# Patient Record
Sex: Male | Born: 1965 | Race: White | Hispanic: No | Marital: Married | Smoking: Current every day smoker
Health system: Southern US, Community
[De-identification: ages and names within clinical notes are randomized; demographics above are authoritative.]

## PROBLEM LIST (undated history)

## (undated) DIAGNOSIS — Z720 Tobacco use: Secondary | ICD-10-CM

## (undated) DIAGNOSIS — I429 Cardiomyopathy, unspecified: Secondary | ICD-10-CM

## (undated) DIAGNOSIS — I5022 Chronic systolic (congestive) heart failure: Secondary | ICD-10-CM

## (undated) DIAGNOSIS — R911 Solitary pulmonary nodule: Secondary | ICD-10-CM

## (undated) DIAGNOSIS — I10 Essential (primary) hypertension: Secondary | ICD-10-CM

## (undated) DIAGNOSIS — I340 Nonrheumatic mitral (valve) insufficiency: Secondary | ICD-10-CM

## (undated) DIAGNOSIS — E782 Mixed hyperlipidemia: Secondary | ICD-10-CM

## (undated) DIAGNOSIS — G4733 Obstructive sleep apnea (adult) (pediatric): Secondary | ICD-10-CM

## (undated) DIAGNOSIS — C679 Malignant neoplasm of bladder, unspecified: Secondary | ICD-10-CM

## (undated) DIAGNOSIS — F1010 Alcohol abuse, uncomplicated: Secondary | ICD-10-CM

## (undated) HISTORY — PX: REPLACEMENT TOTAL KNEE: SUR1224

## (undated) HISTORY — DX: Tobacco use: Z72.0

## (undated) HISTORY — DX: Solitary pulmonary nodule: R91.1

## (undated) HISTORY — DX: Alcohol abuse, uncomplicated: F10.10

## (undated) HISTORY — DX: Nonrheumatic mitral (valve) insufficiency: I34.0

## (undated) HISTORY — DX: Mixed hyperlipidemia: E78.2

## (undated) HISTORY — DX: Cardiomyopathy, unspecified: I42.9

## (undated) HISTORY — DX: Chronic systolic (congestive) heart failure: I50.22

## (undated) HISTORY — DX: Obstructive sleep apnea (adult) (pediatric): G47.33

---

## 2021-04-19 ENCOUNTER — Emergency Department: Payer: Self-pay

## 2021-04-19 ENCOUNTER — Inpatient Hospital Stay
Admission: EM | Admit: 2021-04-19 | Discharge: 2021-04-21 | DRG: 190 | Disposition: A | Discharge status: Home / Self Care | Payer: Self-pay | Attending: Internal Medicine | Admitting: Internal Medicine

## 2021-04-19 ENCOUNTER — Other Ambulatory Visit: Payer: Self-pay

## 2021-04-19 ENCOUNTER — Observation Stay (HOSPITAL_COMMUNITY)
Admit: 2021-04-19 | Discharge: 2021-04-19 | Disposition: A | Discharge status: Home / Self Care | Payer: Self-pay | Attending: Internal Medicine | Admitting: Internal Medicine

## 2021-04-19 DIAGNOSIS — F10230 Alcohol dependence with withdrawal, uncomplicated: Secondary | ICD-10-CM | POA: Diagnosis present

## 2021-04-19 DIAGNOSIS — D72829 Elevated white blood cell count, unspecified: Secondary | ICD-10-CM | POA: Diagnosis present

## 2021-04-19 DIAGNOSIS — Z720 Tobacco use: Secondary | ICD-10-CM

## 2021-04-19 DIAGNOSIS — I340 Nonrheumatic mitral (valve) insufficiency: Secondary | ICD-10-CM | POA: Diagnosis present

## 2021-04-19 DIAGNOSIS — R778 Other specified abnormalities of plasma proteins: Secondary | ICD-10-CM | POA: Diagnosis present

## 2021-04-19 DIAGNOSIS — Z8551 Personal history of malignant neoplasm of bladder: Secondary | ICD-10-CM

## 2021-04-19 DIAGNOSIS — R911 Solitary pulmonary nodule: Secondary | ICD-10-CM

## 2021-04-19 DIAGNOSIS — J9601 Acute respiratory failure with hypoxia: Secondary | ICD-10-CM

## 2021-04-19 DIAGNOSIS — I429 Cardiomyopathy, unspecified: Secondary | ICD-10-CM | POA: Diagnosis present

## 2021-04-19 DIAGNOSIS — R079 Chest pain, unspecified: Secondary | ICD-10-CM

## 2021-04-19 DIAGNOSIS — E669 Obesity, unspecified: Secondary | ICD-10-CM | POA: Diagnosis present

## 2021-04-19 DIAGNOSIS — R0602 Shortness of breath: Secondary | ICD-10-CM

## 2021-04-19 DIAGNOSIS — F17200 Nicotine dependence, unspecified, uncomplicated: Secondary | ICD-10-CM | POA: Diagnosis present

## 2021-04-19 DIAGNOSIS — J9600 Acute respiratory failure, unspecified whether with hypoxia or hypercapnia: Secondary | ICD-10-CM | POA: Diagnosis present

## 2021-04-19 DIAGNOSIS — I10 Essential (primary) hypertension: Secondary | ICD-10-CM | POA: Diagnosis present

## 2021-04-19 DIAGNOSIS — J441 Chronic obstructive pulmonary disease with (acute) exacerbation: Principal | ICD-10-CM | POA: Diagnosis present

## 2021-04-19 DIAGNOSIS — I110 Hypertensive heart disease with heart failure: Secondary | ICD-10-CM | POA: Diagnosis present

## 2021-04-19 DIAGNOSIS — F17210 Nicotine dependence, cigarettes, uncomplicated: Secondary | ICD-10-CM | POA: Diagnosis present

## 2021-04-19 DIAGNOSIS — Z9114 Patient's other noncompliance with medication regimen: Secondary | ICD-10-CM

## 2021-04-19 DIAGNOSIS — Z20822 Contact with and (suspected) exposure to covid-19: Secondary | ICD-10-CM | POA: Diagnosis present

## 2021-04-19 DIAGNOSIS — F17213 Nicotine dependence, cigarettes, with withdrawal: Secondary | ICD-10-CM

## 2021-04-19 DIAGNOSIS — Z96652 Presence of left artificial knee joint: Secondary | ICD-10-CM | POA: Diagnosis present

## 2021-04-19 DIAGNOSIS — Z6831 Body mass index (BMI) 31.0-31.9, adult: Secondary | ICD-10-CM

## 2021-04-19 DIAGNOSIS — I5042 Chronic combined systolic (congestive) and diastolic (congestive) heart failure: Secondary | ICD-10-CM

## 2021-04-19 DIAGNOSIS — Y905 Blood alcohol level of 100-119 mg/100 ml: Secondary | ICD-10-CM | POA: Diagnosis present

## 2021-04-19 DIAGNOSIS — G4733 Obstructive sleep apnea (adult) (pediatric): Secondary | ICD-10-CM | POA: Diagnosis present

## 2021-04-19 DIAGNOSIS — R7989 Other specified abnormal findings of blood chemistry: Secondary | ICD-10-CM | POA: Diagnosis present

## 2021-04-19 DIAGNOSIS — T380X5A Adverse effect of glucocorticoids and synthetic analogues, initial encounter: Secondary | ICD-10-CM | POA: Diagnosis present

## 2021-04-19 DIAGNOSIS — F1020 Alcohol dependence, uncomplicated: Secondary | ICD-10-CM | POA: Diagnosis present

## 2021-04-19 DIAGNOSIS — R0609 Other forms of dyspnea: Secondary | ICD-10-CM

## 2021-04-19 DIAGNOSIS — I444 Left anterior fascicular block: Secondary | ICD-10-CM | POA: Diagnosis present

## 2021-04-19 DIAGNOSIS — R918 Other nonspecific abnormal finding of lung field: Secondary | ICD-10-CM

## 2021-04-19 DIAGNOSIS — F1090 Alcohol use, unspecified, uncomplicated: Secondary | ICD-10-CM

## 2021-04-19 HISTORY — DX: Essential (primary) hypertension: I10

## 2021-04-19 HISTORY — DX: Malignant neoplasm of bladder, unspecified: C67.9

## 2021-04-19 LAB — CBC
HCT: 44.2 % (ref 39.0–52.0)
Hemoglobin: 13.9 g/dL (ref 13.0–17.0)
MCH: 27.4 pg (ref 26.0–34.0)
MCHC: 31.4 g/dL (ref 30.0–36.0)
MCV: 87.2 fL (ref 80.0–100.0)
Platelets: 290 10*3/uL (ref 150–400)
RBC: 5.07 MIL/uL (ref 4.22–5.81)
RDW: 16.9 % — ABNORMAL HIGH (ref 11.5–15.5)
WBC: 14.8 10*3/uL — ABNORMAL HIGH (ref 4.0–10.5)
nRBC: 0 % (ref 0.0–0.2)

## 2021-04-19 LAB — ECHOCARDIOGRAM COMPLETE
AR max vel: 1.69 cm2
AV Area VTI: 1.85 cm2
AV Area mean vel: 1.82 cm2
AV Mean grad: 9 mmHg
AV Peak grad: 18 mmHg
Ao pk vel: 2.12 m/s
Area-P 1/2: 5.46 cm2
Calc EF: 41.4 %
Height: 72 in
MV VTI: 2.36 cm2
S' Lateral: 4.58 cm
Single Plane A2C EF: 38.8 %
Single Plane A4C EF: 45.9 %
Weight: 3760 oz

## 2021-04-19 LAB — TROPONIN I (HIGH SENSITIVITY)
Troponin I (High Sensitivity): 61 ng/L — ABNORMAL HIGH (ref <18)
Troponin I (High Sensitivity): 57 ng/L — ABNORMAL HIGH (ref <18)
Troponin I (High Sensitivity): 52 ng/L — ABNORMAL HIGH (ref <18)
Troponin I (High Sensitivity): 42 ng/L — ABNORMAL HIGH (ref <18)

## 2021-04-19 LAB — BLOOD GAS, ARTERIAL
Acid-Base Excess: 1.3 mmol/L (ref 0.0–2.0)
Bicarbonate: 27.2 mmol/L (ref 20.0–28.0)
O2 Content: 2 L/min
O2 Saturation: 96.6 %
Patient temperature: 37
pCO2 arterial: 47 mmHg (ref 32–48)
pH, Arterial: 7.37 (ref 7.35–7.45)
pO2, Arterial: 74 mmHg — ABNORMAL LOW (ref 83–108)

## 2021-04-19 LAB — BASIC METABOLIC PANEL
Anion gap: 8 (ref 5–15)
BUN: 20 mg/dL (ref 6–20)
CO2: 26 mmol/L (ref 22–32)
Calcium: 8.7 mg/dL — ABNORMAL LOW (ref 8.9–10.3)
Chloride: 110 mmol/L (ref 98–111)
Creatinine, Ser: 1.14 mg/dL (ref 0.61–1.24)
GFR, Estimated: >60 mL/min (ref >60)
Glucose, Bld: 124 mg/dL — ABNORMAL HIGH (ref 70–99)
Potassium: 3.5 mmol/L (ref 3.5–5.1)
Sodium: 144 mmol/L (ref 135–145)

## 2021-04-19 LAB — BRAIN NATRIURETIC PEPTIDE: B Natriuretic Peptide: 233.5 pg/mL — ABNORMAL HIGH (ref 0.0–100.0)

## 2021-04-19 LAB — RESP PANEL BY RT-PCR (FLU A&B, COVID) ARPGX2
Influenza A by PCR: NEGATIVE
Influenza B by PCR: NEGATIVE
SARS Coronavirus 2 by RT PCR: NEGATIVE

## 2021-04-19 LAB — ETHANOL: Alcohol, Ethyl (B): 108 mg/dL — ABNORMAL HIGH (ref <10)

## 2021-04-19 LAB — HIV ANTIBODY (ROUTINE TESTING W REFLEX): HIV Screen 4th Generation wRfx: NONREACTIVE

## 2021-04-19 MED ORDER — ENOXAPARIN SODIUM 60 MG/0.6ML IJ SOSY
0.5000 mg/kg | PREFILLED_SYRINGE | INTRAMUSCULAR | Status: DC
  Administered 2021-04-19 – 2021-04-20 (×2): 52.5 mg via SUBCUTANEOUS
  Filled 2021-04-19 (×2): qty 0.6

## 2021-04-19 MED ORDER — IPRATROPIUM-ALBUTEROL 0.5-2.5 (3) MG/3ML IN SOLN: 3.0000 mL | Freq: Four times a day (QID) | RESPIRATORY_TRACT | Status: DC

## 2021-04-19 MED ORDER — ASPIRIN 81 MG PO CHEW
324.0000 mg | CHEWABLE_TABLET | Freq: Once | ORAL | Status: AC
  Administered 2021-04-19: 324 mg via ORAL
  Filled 2021-04-19: qty 4

## 2021-04-19 MED ORDER — SODIUM CHLORIDE 0.9% FLUSH
3.0000 mL | Freq: Two times a day (BID) | INTRAVENOUS | Status: DC
  Administered 2021-04-19 – 2021-04-21 (×5): 3 mL via INTRAVENOUS

## 2021-04-19 MED ORDER — IPRATROPIUM-ALBUTEROL 0.5-2.5 (3) MG/3ML IN SOLN
3.0000 mL | Freq: Once | RESPIRATORY_TRACT | Status: AC
  Administered 2021-04-19: 3 mL via RESPIRATORY_TRACT
  Filled 2021-04-19: qty 3

## 2021-04-19 MED ORDER — SODIUM CHLORIDE 0.9% FLUSH: 3.0000 mL | INTRAVENOUS | Status: DC | PRN

## 2021-04-19 MED ORDER — ADULT MULTIVITAMIN W/MINERALS CH
1.0000 | ORAL_TABLET | Freq: Every day | ORAL | Status: DC
  Administered 2021-04-19 – 2021-04-21 (×3): 1 via ORAL
  Filled 2021-04-19 (×3): qty 1

## 2021-04-19 MED ORDER — THIAMINE HCL 100 MG PO TABS
100.0000 mg | ORAL_TABLET | Freq: Every day | ORAL | Status: DC
  Administered 2021-04-19 – 2021-04-21 (×3): 100 mg via ORAL
  Filled 2021-04-19 (×3): qty 1

## 2021-04-19 MED ORDER — ENOXAPARIN SODIUM 40 MG/0.4ML IJ SOSY: 40.0000 mg | PREFILLED_SYRINGE | INTRAMUSCULAR | Status: DC

## 2021-04-19 MED ORDER — IPRATROPIUM-ALBUTEROL 0.5-2.5 (3) MG/3ML IN SOLN: 3.0000 mL | RESPIRATORY_TRACT | Status: DC | PRN

## 2021-04-19 MED ORDER — LORAZEPAM 2 MG/ML IJ SOLN: 1.0000 mg | INTRAMUSCULAR | Status: DC | PRN

## 2021-04-19 MED ORDER — LORAZEPAM 2 MG/ML IJ SOLN: 0.0000 mg | Freq: Two times a day (BID) | INTRAMUSCULAR | Status: DC

## 2021-04-19 MED ORDER — IOHEXOL 350 MG/ML SOLN
100.0000 mL | Freq: Once | INTRAVENOUS | Status: AC | PRN
  Administered 2021-04-19: 100 mL via INTRAVENOUS

## 2021-04-19 MED ORDER — THIAMINE HCL 100 MG/ML IJ SOLN: 100.0000 mg | Freq: Every day | INTRAMUSCULAR | Status: DC

## 2021-04-19 MED ORDER — ONDANSETRON HCL 4 MG/2ML IJ SOLN: 4.0000 mg | Freq: Four times a day (QID) | INTRAMUSCULAR | Status: DC | PRN

## 2021-04-19 MED ORDER — LORAZEPAM 2 MG/ML IJ SOLN: 0.0000 mg | Freq: Four times a day (QID) | INTRAMUSCULAR | Status: AC

## 2021-04-19 MED ORDER — LOSARTAN POTASSIUM 25 MG PO TABS
25.0000 mg | ORAL_TABLET | Freq: Every day | ORAL | Status: DC
  Administered 2021-04-19 – 2021-04-20 (×2): 25 mg via ORAL
  Filled 2021-04-19 (×2): qty 1

## 2021-04-19 MED ORDER — PREDNISONE 20 MG PO TABS: 40.0000 mg | ORAL_TABLET | Freq: Every day | ORAL | Status: DC

## 2021-04-19 MED ORDER — LORAZEPAM 1 MG PO TABS: 1.0000 mg | ORAL_TABLET | ORAL | Status: DC | PRN

## 2021-04-19 MED ORDER — ACETAMINOPHEN 325 MG PO TABS: 650.0000 mg | ORAL_TABLET | Freq: Four times a day (QID) | ORAL | Status: DC | PRN

## 2021-04-19 MED ORDER — METHYLPREDNISOLONE SODIUM SUCC 40 MG IJ SOLR
40.0000 mg | Freq: Two times a day (BID) | INTRAMUSCULAR | Status: AC
  Administered 2021-04-19 – 2021-04-20 (×2): 40 mg via INTRAVENOUS
  Filled 2021-04-19 (×2): qty 1

## 2021-04-19 MED ORDER — FOLIC ACID 1 MG PO TABS
1.0000 mg | ORAL_TABLET | Freq: Every day | ORAL | Status: DC
  Administered 2021-04-19 – 2021-04-21 (×3): 1 mg via ORAL
  Filled 2021-04-19 (×3): qty 1

## 2021-04-19 MED ORDER — AMLODIPINE BESYLATE 5 MG PO TABS
5.0000 mg | ORAL_TABLET | Freq: Every day | ORAL | Status: DC
  Administered 2021-04-19: 5 mg via ORAL
  Filled 2021-04-19: qty 1

## 2021-04-19 MED ORDER — ONDANSETRON HCL 4 MG PO TABS: 4.0000 mg | ORAL_TABLET | Freq: Four times a day (QID) | ORAL | Status: DC | PRN

## 2021-04-19 MED ORDER — ACETAMINOPHEN 650 MG RE SUPP: 650.0000 mg | Freq: Four times a day (QID) | RECTAL | Status: DC | PRN

## 2021-04-19 MED ORDER — ASPIRIN EC 81 MG PO TBEC
81.0000 mg | DELAYED_RELEASE_TABLET | Freq: Every day | ORAL | Status: DC
  Administered 2021-04-19 – 2021-04-21 (×3): 81 mg via ORAL
  Filled 2021-04-19 (×3): qty 1

## 2021-04-19 MED ORDER — SODIUM CHLORIDE 0.9 % IV SOLN: 250.0000 mL | INTRAVENOUS | Status: DC | PRN

## 2021-04-19 MED ORDER — METHYLPREDNISOLONE SODIUM SUCC 125 MG IJ SOLR
125.0000 mg | Freq: Once | INTRAMUSCULAR | Status: AC
  Administered 2021-04-19: 125 mg via INTRAVENOUS
  Filled 2021-04-19: qty 2

## 2021-04-19 MED ORDER — NITROGLYCERIN 0.4 MG SL SUBL
0.4000 mg | SUBLINGUAL_TABLET | SUBLINGUAL | Status: DC | PRN
  Administered 2021-04-19: 0.4 mg via SUBLINGUAL
  Filled 2021-04-19: qty 1

## 2021-04-19 NOTE — ED Triage Notes (Signed)
Pt presents to ER c/o chest pain, and sob x3 days.  Pt states he has been feeling very fatigued over last few days as well and has been having problems walking short distances. Pt denies any hx of cardiac issues.  Denies any recent weight gain.  Pt A&O x4 at this time in NAD.  Speaking in complete sentences.

## 2021-04-19 NOTE — ED Notes (Signed)
Pt sleeping, arousable to voice, denies pain, sob or nausea, dry cough noted, LS diminished clear. Breakfast given.

## 2021-04-19 NOTE — ED Notes (Signed)
Informed RN bed assigned 

## 2021-04-19 NOTE — ED Notes (Signed)
Pt remains alert, NAD, calm, intermittently sleeping, denies pain sob, nausea or dizziness. Meds given.

## 2021-04-19 NOTE — Plan of Care (Signed)
°  Problem: Education: Goal: Knowledge of disease or condition will improve Outcome: Progressing Goal: Knowledge of the prescribed therapeutic regimen will improve Outcome: Progressing Goal: Individualized Educational Video(s) Outcome: Progressing   Problem: Activity: Goal: Ability to tolerate increased activity will improve Outcome: Progressing Goal: Will verbalize the importance of balancing activity with adequate rest periods Outcome: Progressing   Problem: Nutrition: Goal: Adequate nutrition will be maintained Outcome: Progressing   Problem: Coping: Goal: Level of anxiety will decrease Outcome: Progressing   Problem: Elimination: Goal: Will not experience complications related to bowel motility Outcome: Progressing Goal: Will not experience complications related to urinary retention Outcome: Progressing   Problem: Safety: Goal: Ability to remain free from injury will improve Outcome: Progressing

## 2021-04-19 NOTE — Progress Notes (Signed)
*  PRELIMINARY RESULTS* Echocardiogram 2D Echocardiogram has been performed.  Nicholas Chung 04/19/2021, 4:07 PM

## 2021-04-19 NOTE — Progress Notes (Addendum)
PHARMACIST - PHYSICIAN COMMUNICATION  CONCERNING:  Enoxaparin (Lovenox) for DVT Prophylaxis    RECOMMENDATION: Patient was prescribed enoxaprin 40mg  q24 hours for VTE prophylaxis.   Filed Weights   04/19/21 0246  Weight: 106.6 kg (235 lb)    Body mass index is 31.87 kg/m.  Estimated Creatinine Clearance: 90.2 mL/min (by C-G formula based on SCr of 1.14 mg/dL).   Based on [place] patient is candidate for enoxaparin 0.5mg /kg TBW SQ every 24 hours based on BMI being >30.   DESCRIPTION: Pharmacy has adjusted enoxaparin dose per [facility] policy.  Patient is now receiving enoxaparin 52.5 mg every 24 hours   Pernell Dupre, PharmD, BCPS Clinical Pharmacist 04/19/2021 9:13 AM

## 2021-04-19 NOTE — Consult Note (Signed)
Cardiology Consultation:   Patient ID: Nicholas Chung MRN: [id number]; DOB: 13-Mar-1965  Admit date: 04/19/2021 Date of Consult: 04/19/2021  PCP:  Pcp, No   [facility] Providers Cardiologist:  None   new Fletcher Anon)     Patient Profile:   Nicholas Chung is a 56 y.o. male with a hx of essential hypertension, previous bladder cancer, tobacco and alcohol use who is being seen 04/19/2021 for the evaluation of elevated troponin at the request of Dr. Francine Graven.  History of Present Illness:   Mr. Nicholas Chung is a 56 year old male with no prior cardiac history.  He moved recently from [place].  He has not seen a physician in at least 2 years.  He reports known history of essential hypertension and was on antihypertensive medications in the past but not in the last 2 years.  He reports prior history of bladder cancer that was treated as well as history of sleep apnea previously on CPAP but not recently.  He smokes 1 pack/day and drinks 1 pint of liquor every day.  He has not seek any recent medical evaluation due to lack of health insurance. He presented with progressive exertional dyspnea that progressed to the point of happening at rest.  In addition, he had some intermittent chest pain and tightness that was mostly triggered in the setting of severe nonproductive cough.  He has not noticed any change in his weight and denies lower extremity edema.  He does have orthopnea but no PND.  The patient was noted to be mildly tachycardic and significantly hypertensive on presentation.   Past Medical History:  Diagnosis Date   Bladder cancer ([place])    Hypertension     Past Surgical History:  Procedure Laterality Date   REPLACEMENT TOTAL KNEE Left      Home Medications:  Prior to Admission medications   Not on File    Inpatient Medications: Scheduled Meds:  amLODipine  5 mg Oral Daily   aspirin EC  81 mg Oral Daily   enoxaparin (LOVENOX) injection  0.5 mg/kg Subcutaneous Q65H   folic acid  1 mg Oral  Daily   LORazepam  0-4 mg Intravenous Q6H   Followed by   Derrill Memo ON 04/21/2021] LORazepam  0-4 mg Intravenous Q12H   methylPREDNISolone (SOLU-MEDROL) injection  40 mg Intravenous Q12H   Followed by   Derrill Memo ON 04/20/2021] predniSONE  40 mg Oral Q breakfast   multivitamin with minerals  1 tablet Oral Daily   sodium chloride flush  3 mL Intravenous Q12H   thiamine  100 mg Oral Daily   Or   thiamine  100 mg Intravenous Daily   Continuous Infusions:  sodium chloride     PRN Meds: sodium chloride, acetaminophen **OR** acetaminophen, ipratropium-albuterol, LORazepam **OR** LORazepam, nitroGLYCERIN, ondansetron **OR** ondansetron (ZOFRAN) IV, sodium chloride flush  Allergies:   No Known Allergies  Social History:   Social History   Socioeconomic History   Marital status: Married    Spouse name: Not on file   Number of children: Not on file   Years of education: Not on file   Highest education level: Not on file  Occupational History   Not on file  Tobacco Use   Smoking status: Every Day    Packs/day: 1.00    Types: Cigarettes   Smokeless tobacco: Never  Substance and Sexual Activity   Alcohol use: Yes    Comment: 2 pints/week   Drug use: Never   Sexual activity: Not on file  Other Topics Concern  Not on file  Social History Narrative   Not on file   Social Determinants of Health   Financial Resource Strain: Not on file  Food Insecurity: Not on file  Transportation Needs: Not on file  Physical Activity: Not on file  Stress: Not on file  Social Connections: Not on file  Intimate Partner Violence: Not on file    Family History:   He reports that his brother died of cancer.  No family history of coronary artery disease.  ROS:  Please see the history of present illness.   All other ROS reviewed and negative.     Physical Exam/Data:   Vitals:   04/19/21 1030 04/19/21 1045 04/19/21 1100 04/19/21 1115  BP: (!) 172/101 102/70 (!) 161/92 (!) 181/117  Pulse:  94 93  94  Resp: (!) 25 (!) 29 (!) 27 (!) 26  Temp:      TempSrc:      SpO2:  98% 95% 95%  Weight:      Height:       No intake or output data in the 24 hours ending 04/19/21 1155 Last 3 Weights 04/19/2021  Weight (lbs) 235 lb  Weight (kg) 106.595 kg     Body mass index is 31.87 kg/m.  General:  Well nourished, well developed, mild respiratory distress HEENT: normal Neck: Jugular venous pressure is not well visualized Vascular: No carotid bruits; Distal pulses 2+ bilaterally Cardiac:  normal S1, S2; RRR; 2/6 holosystolic murmur at the left sternal border. Lungs:  clear to auscultation bilaterally, no wheezing, rhonchi or rales.  He does have diminished breath sounds bilaterally. Abd: soft, nontender, no hepatomegaly  Ext: Trace bilateral edema Musculoskeletal:  No deformities, BUE and BLE strength normal and equal Skin: warm and dry  Neuro:  CNs 2-12 intact, no focal abnormalities noted Psych:  Normal affect   EKG:  The EKG was personally reviewed and demonstrates: Sinus tachycardia with possible old anterior infarct and left anterior fascicular block. Telemetry:  Telemetry was personally reviewed and demonstrates: Sinus rhythm with sinus tachycardia  Relevant CV Studies: Echocardiogram is pending.  Laboratory Data:  High Sensitivity Troponin:   Recent Labs  Lab 04/19/21 0250 04/19/21 0451 04/19/21 0911  TROPONINIHS 61* 57* 52*     Chemistry Recent Labs  Lab 04/19/21 0250  NA 144  K 3.5  CL 110  CO2 26  GLUCOSE 124*  BUN 20  CREATININE 1.14  CALCIUM 8.7*  GFRNONAA >60  ANIONGAP 8    No results for input(s): PROT, ALBUMIN, AST, ALT, ALKPHOS, BILITOT in the last 168 hours. Lipids No results for input(s): CHOL, TRIG, HDL, LABVLDL, LDLCALC, CHOLHDL in the last 168 hours.  Hematology Recent Labs  Lab 04/19/21 0250  WBC 14.8*  RBC 5.07  HGB 13.9  HCT 44.2  MCV 87.2  MCH 27.4  MCHC 31.4  RDW 16.9*  PLT 290   Thyroid No results for input(s): TSH, FREET4 in  the last 168 hours.  BNP Recent Labs  Lab 04/19/21 0250  BNP 233.5*    DDimer No results for input(s): DDIMER in the last 168 hours.   Radiology/Studies:  DG Chest 2 View  Result Date: 04/19/2021 CLINICAL DATA:  Shortness of breath and chest pain EXAM: CHEST - 2 VIEW COMPARISON:  None. FINDINGS: The heart size and mediastinal contours are within normal limits. Both lungs are clear. The visualized skeletal structures are unremarkable. IMPRESSION: No active cardiopulmonary disease. Electronically Signed   By: Ulyses Jarred M.D.   On:  04/19/2021 03:12   CT Angio Chest PE W and/or Wo Contrast  Result Date: 04/19/2021 CLINICAL DATA:  Chest pain, shortness of breath. EXAM: CT ANGIOGRAPHY CHEST WITH CONTRAST TECHNIQUE: Multidetector CT imaging of the chest was performed using the standard protocol during bolus administration of intravenous contrast. Multiplanar CT image reconstructions and MIPs were obtained to evaluate the vascular anatomy. RADIATION DOSE REDUCTION: This exam was performed according to the departmental dose-optimization program which includes automated exposure control, adjustment of the mA and/or kV according to patient size and/or use of iterative reconstruction technique. CONTRAST:  114mL OMNIPAQUE IOHEXOL 350 MG/ML SOLN COMPARISON:  Radiograph of same day. FINDINGS: Cardiovascular: There is no evidence of large central pulmonary embolus. However, due to persistent respiratory motion artifact, smaller and more peripheral pulmonary emboli in the lower lobe branches bilaterally cannot be excluded on the basis of this exam. Normal cardiac size. No pericardial effusion. Mediastinum/Nodes: No enlarged mediastinal, hilar, or axillary lymph nodes. Thyroid gland, trachea, and esophagus demonstrate no significant findings. Lungs/Pleura: No pneumothorax or pleural effusion is noted. 13 x 10 mm nodular density is noted laterally in the right lower lobe best seen on image number 67 of series 6.  Left lung is unremarkable. Upper Abdomen: No acute abnormality. Musculoskeletal: No chest wall abnormality. No acute or significant osseous findings. Review of the MIP images confirms the above findings. IMPRESSION: There is no evidence of large central pulmonary embolus. However, due to persistent respiratory motion artifact, smaller and more peripheral pulmonary emboli in the lower lobe branches bilaterally cannot be excluded on the basis of this exam. 13 x 10 mm nodular density noted laterally in the right lower lobe. Consider one of the following in 3 months for both low-risk and high-risk individuals: (a) repeat chest CT, (b) follow-up PET-CT, or (c) tissue sampling. This recommendation follows the consensus statement: Guidelines for Management of Incidental Pulmonary Nodules Detected on CT Images: From the Fleischner Society 2017; Radiology 2017; 284:228-243. Electronically Signed   By: Marijo Conception M.D.   On: 04/19/2021 05:30     Assessment and Plan:   Elevated troponin: Likely due to supply demand ischemia in the setting of COPD exacerbation.  Nonetheless, the patient has multiple risk factors for coronary artery disease and will require further ischemic cardiac evaluation.  I agree with an echocardiogram.  If his ejection fraction is low, we will proceed with a right and left cardiac catheterization early next week.  If EF and wall motion are both normal, stress testing can be considered. Acute respiratory failure: Suspect likely due to COPD exacerbation in the setting of active tobacco use.  No clear evidence of volume overload by exam and his BNP was only mildly elevated at 233.  However, I am concerned that the patient might have underlying cardiomyopathy given his untreated hypertension as well as excessive alcohol use.  This will be evaluated with echocardiogram. Essential hypertension: Known history of hypertension but has not been on medications.  He was started here on amlodipine 5 mg once  daily but blood pressure still significantly elevated.  I will go ahead and add small dose losartan. 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  He knows that he needs to quit.  Monitor for withdrawal symptoms. Tobacco use: I discussed the importance of smoking cessation and he wants to quit. Previous history of sleep apnea: Outpatient sleep testing is recommended.       For questions or updates, please contact [place] Please consult [email or web address]  for contact info under    Signed, Kathlyn Sacramento, MD  04/19/2021 11:55 AM

## 2021-04-19 NOTE — ED Provider Notes (Signed)
[facility] Provider Note    Event Date/Time   First MD Initiated Contact with Patient 04/19/21 [phone]     (approximate)   History   Shortness of Breath and Chest Pain   HPI  Nicholas Chung is a 56 y.o. male with history of hypertension, tobacco use, bladder cancer in remission who presents to the emergency department with his wife for concerns of chest tightness, shortness of breath worse with exertion, generalized weakness and fatigue that have been worsening over the past several days.  Has had nonproductive cough but no fever.  No lower extremity swelling or pain.  No history of PE, DVT, CHF, COPD or asthma.   History provided by patient and wife.    Past Medical History:  Diagnosis Date   Bladder cancer ([place])    Hypertension     Past Surgical History:  Procedure Laterality Date   REPLACEMENT TOTAL KNEE Left     MEDICATIONS:  Prior to Admission medications   Not on File    Physical Exam   Triage Vital Signs: ED Triage Vitals  Enc Vitals Group     BP 04/19/21 0244 (!) 170/99     Pulse Rate 04/19/21 0244 (!) 102     Resp 04/19/21 0244 (!) 22     Temp 04/19/21 0244 98.7 F (37.1 C)     Temp Source 04/19/21 0244 Oral     SpO2 04/19/21 0244 (!) 89 %     Weight 04/19/21 0246 235 lb (106.6 kg)     Height 04/19/21 0246 6' (1.829 m)     Head Circumference --      Peak Flow --      Pain Score 04/19/21 0245 7     Pain Loc --      Pain Edu? --      Excl. in [place]? --     Most recent vital signs: Vitals:   04/19/21 0457 04/19/21 0530  BP: (!) 161/113 (!) 158/98  Pulse: 99 96  Resp: (!) 34 (!) 33  Temp:    SpO2: 95% 97%    CONSTITUTIONAL: Alert and oriented and responds appropriately to questions. Well-appearing; well-nourished HEAD: Normocephalic, atraumatic EYES: Conjunctivae clear, pupils appear equal, sclera nonicteric ENT: normal nose; moist mucous membranes NECK: Supple, normal ROM CARD: Regular and tachycardic; S1 and S2  appreciated; no murmurs, no clicks, no rubs, no gallops RESP: Patient is tachypneic with increased work of breathing but able to speak full sentences.  He has diminished aeration at his bases bilaterally but no rhonchi, wheezing or rales.  Hypoxic to 87% on room air at rest. ABD/GI: Normal bowel sounds; non-distended; soft, non-tender, no rebound, no guarding, no peritoneal signs BACK: The back appears normal EXT: Normal ROM in all joints; no deformity noted, no edema; no cyanosis, no calf tenderness or calf swelling SKIN: Normal color for age and race; warm; no rash on exposed skin NEURO: Moves all extremities equally, normal speech PSYCH: The patient's mood and manner are appropriate.   ED Results / Procedures / Treatments   LABS: (all labs ordered are listed, but only abnormal results are displayed) Labs Reviewed  BASIC METABOLIC PANEL - Abnormal; Notable for the following components:      Result Value   Glucose, Bld 124 (*)    Calcium 8.7 (*)    All other components within normal limits  CBC - Abnormal; Notable for the following components:   WBC 14.8 (*)    RDW 16.9 (*)  All other components within normal limits  BRAIN NATRIURETIC PEPTIDE - Abnormal; Notable for the following components:   B Natriuretic Peptide 233.5 (*)    All other components within normal limits  ETHANOL - Abnormal; Notable for the following components:   Alcohol, Ethyl (B) 108 (*)    All other components within normal limits  BLOOD GAS, ARTERIAL - Abnormal; Notable for the following components:   pO2, Arterial 74 (*)    All other components within normal limits  TROPONIN I (HIGH SENSITIVITY) - Abnormal; Notable for the following components:   Troponin I (High Sensitivity) 61 (*)    All other components within normal limits  TROPONIN I (HIGH SENSITIVITY) - Abnormal; Notable for the following components:   Troponin I (High Sensitivity) 57 (*)    All other components within normal limits  RESP PANEL BY  RT-PCR (FLU A&B, COVID) [id number]     EKG:  EKG Interpretation  Date/Time:  Friday April 19 2021 02:48:16 EST Ventricular Rate:  102 PR Interval:  136 QRS Duration: 84 QT Interval:  384 QTC Calculation: 500 R Axis:   -60 Text Interpretation: Sinus tachycardia Possible Left atrial enlargement Left anterior fascicular block Anterior infarct , age undetermined Abnormal ECG No previous ECGs available Confirmed by Pryor Curia [phone]) on 04/19/2021 6:13:55 AM         RADIOLOGY: My personal review and interpretation of imaging: Chest x-ray clear.  CT scan shows no obvious PE.  I have personally reviewed all radiology reports.   DG Chest 2 View  Result Date: 04/19/2021 CLINICAL DATA:  Shortness of breath and chest pain EXAM: CHEST - 2 VIEW COMPARISON:  None. FINDINGS: The heart size and mediastinal contours are within normal limits. Both lungs are clear. The visualized skeletal structures are unremarkable. IMPRESSION: No active cardiopulmonary disease. Electronically Signed   By: Ulyses Jarred M.D.   On: 04/19/2021 03:12   CT Angio Chest PE W and/or Wo Contrast  Result Date: 04/19/2021 CLINICAL DATA:  Chest pain, shortness of breath. EXAM: CT ANGIOGRAPHY CHEST WITH CONTRAST TECHNIQUE: Multidetector CT imaging of the chest was performed using the standard protocol during bolus administration of intravenous contrast. Multiplanar CT image reconstructions and MIPs were obtained to evaluate the vascular anatomy. RADIATION DOSE REDUCTION: This exam was performed according to the departmental dose-optimization program which includes automated exposure control, adjustment of the mA and/or kV according to patient size and/or use of iterative reconstruction technique. CONTRAST:  115mL OMNIPAQUE IOHEXOL 350 MG/ML SOLN COMPARISON:  Radiograph of same day. FINDINGS: Cardiovascular: There is no evidence of large central pulmonary embolus. However, due to persistent respiratory motion artifact, smaller and  more peripheral pulmonary emboli in the lower lobe branches bilaterally cannot be excluded on the basis of this exam. Normal cardiac size. No pericardial effusion. Mediastinum/Nodes: No enlarged mediastinal, hilar, or axillary lymph nodes. Thyroid gland, trachea, and esophagus demonstrate no significant findings. Lungs/Pleura: No pneumothorax or pleural effusion is noted. 13 x 10 mm nodular density is noted laterally in the right lower lobe best seen on image number 67 of series 6. Left lung is unremarkable. Upper Abdomen: No acute abnormality. Musculoskeletal: No chest wall abnormality. No acute or significant osseous findings. Review of the MIP images confirms the above findings. IMPRESSION: There is no evidence of large central pulmonary embolus. However, due to persistent respiratory motion artifact, smaller and more peripheral pulmonary emboli in the lower lobe branches bilaterally cannot be excluded on the basis of this exam. 13 x 10 mm nodular  density noted laterally in the right lower lobe. Consider one of the following in 3 months for both low-risk and high-risk individuals: (a) repeat chest CT, (b) follow-up PET-CT, or (c) tissue sampling. This recommendation follows the consensus statement: Guidelines for Management of Incidental Pulmonary Nodules Detected on CT Images: From the Fleischner Society 2017; Radiology 2017; 284:228-243. Electronically Signed   By: Marijo Conception M.D.   On: 04/19/2021 05:30     PROCEDURES:  Critical Care performed: Yes, see critical care procedure note(s)   CRITICAL CARE Performed by: Cyril Mourning Omeka Holben   Total critical care time: 45 minutes  Critical care time was exclusive of separately billable procedures and treating other patients.  Critical care was necessary to treat or prevent imminent or life-threatening deterioration.  Critical care was time spent personally by me on the following activities: development of treatment plan with patient and/or surrogate as  well as nursing, discussions with consultants, evaluation of patient's response to treatment, examination of patient, obtaining history from patient or surrogate, ordering and performing treatments and interventions, ordering and review of laboratory studies, ordering and review of radiographic studies, pulse oximetry and re-evaluation of patient's condition.   Marland Kitchen1-3 Lead EKG Interpretation Performed by: Tahirih Lair, Delice Bison, DO Authorized by: Aveer Bartow, Delice Bison, DO     Interpretation: abnormal     ECG rate:  105   ECG rate assessment: tachycardic     Rhythm: sinus tachycardia     Ectopy: none     Conduction: normal      IMPRESSION / MDM / ASSESSMENT AND PLAN / ED COURSE  I reviewed the triage vital signs and the nursing notes.    Patient here with chest pain, shortness of breath with exertion, generalized weakness and fatigue.  The patient is on the cardiac monitor to evaluate for evidence of arrhythmia and/or significant heart rate changes.   DIFFERENTIAL DIAGNOSIS (includes but not limited to):   ACS, PE, CHF, pneumonia, pneumothorax, COPD, bronchitis, anemia, viral URI   PLAN: We will obtain CBC, BMP, troponin, BNP, COVID and flu swabs, chest x-ray, EKG.  Patient will need admission given he has a new oxygen requirement.  Given diminished aeration on exam with history of tobacco use, will give DuoNebs, Solu-Medrol.   MEDICATIONS GIVEN IN ED: Medications  nitroGLYCERIN (NITROSTAT) SL tablet 0.4 mg (0.4 mg Sublingual Given 04/19/21 0504)  ipratropium-albuterol (DUONEB) 0.5-2.5 (3) MG/3ML nebulizer solution 3 mL (has no administration in time range)  ipratropium-albuterol (DUONEB) 0.5-2.5 (3) MG/3ML nebulizer solution 3 mL (3 mLs Nebulization Given 04/19/21 0504)  aspirin chewable tablet 324 mg (324 mg Oral Given 04/19/21 0504)  iohexol (OMNIPAQUE) 350 MG/ML injection 100 mL (100 mLs Intravenous Contrast Given 04/19/21 0511)  ipratropium-albuterol (DUONEB) 0.5-2.5 (3) MG/3ML nebulizer  solution 3 mL (3 mLs Nebulization Given 04/19/21 0610)  methylPREDNISolone sodium succinate (SOLU-MEDROL) 125 mg/2 mL injection 125 mg (125 mg Intravenous Given 04/19/21 0610)  ipratropium-albuterol (DUONEB) 0.5-2.5 (3) MG/3ML nebulizer solution 3 mL (3 mLs Nebulization Given 04/19/21 5093)     ED COURSE: Patient reports no significant improvement after DuoNebs.  No significant anemia.  Troponin x2 slightly elevated but stable.  BNP slightly elevated at 233.  Chest x-ray reviewed by myself and radiologist and shows no infiltrate, edema or pneumothorax.  We will obtain a CTA of the chest to evaluate for PE.  Will give aspirin, nitroglycerin.    CTA reviewed by myself and radiologist and shows no obvious PE but limited due to respiratory motion artifact due to his  tachypnea.  He does have a pulmonary nodule in the right lower lobe.  No infiltrate or edema.  ABG obtained and shows hypoxia but no hypercapnia.  Normal pH.  Will discuss with medicine for admission.   CONSULTS:  Consulted and discussed patient's case with hospitalist, Dr. Damita Dunnings.  I have recommended admission and consulting physician agrees and will place admission orders.  Patient (and family if present) agree with this plan.   I reviewed all nursing notes, vitals, pertinent previous records.  All labs, EKGs, imaging ordered have been independently reviewed and interpreted by myself.    OUTSIDE RECORDS REVIEWED: No previous records for review.         FINAL CLINICAL IMPRESSION(S) / ED DIAGNOSES   Final diagnoses:  Dyspnea on exertion  Chest pain, unspecified type  Acute respiratory failure with hypoxia ([place])     Rx / DC Orders   ED Discharge Orders     None        Note:  This document was prepared using Dragon voice recognition software and may include unintentional dictation errors.   Fartun Paradiso, Delice Bison, DO 04/19/21 [phone]

## 2021-04-19 NOTE — ED Notes (Signed)
Lunch eaten. Sleeping, arousable to voice, BP meds given, updated.

## 2021-04-19 NOTE — Hospital Course (Signed)
233.5/61-->57 Wbc 14.8

## 2021-04-19 NOTE — TOC Initial Note (Signed)
Transition of Care [facility]) - Initial/Assessment Note    Patient Details  Name: Nicholas Chung MRN: [id number] Date of Birth: 09/12/1965  [facility]) CM/SW Contact:    Candie Chroman, LCSW Phone Number: 04/19/2021, 3:37 PM  Clinical Narrative:  Called patient in the room, introduced role, and inquired about interest in SA resources. Patient declined. No further concerns. CSW encouraged patient to contact CSW as needed. CSW will continue to follow patient for support and facilitate return home when stable.                Expected Discharge Plan: Home/Self Care Barriers to Discharge: Continued Medical Work up   Patient Goals and CMS Choice        Expected Discharge Plan and Services Expected Discharge Plan: Home/Self Care     Post Acute Care Choice: NA Living arrangements for the past 2 months: Single Family Home                                      Prior Living Arrangements/Services Living arrangements for the past 2 months: Single Family Home Lives with:: Spouse Patient language and need for interpreter reviewed:: Yes Do you feel safe going back to the place where you live?: Yes      Need for Family Participation in Patient Care: Yes (Comment) Care giver support system in place?: Yes (comment)   Criminal Activity/Legal Involvement Pertinent to Current Situation/Hospitalization: No - Comment as needed  Activities of Daily Living Home Assistive Devices/Equipment: None ADL Screening (condition at time of admission) Patient's cognitive ability adequate to safely complete daily activities?: Yes Is the patient deaf or have difficulty hearing?: No Does the patient have difficulty seeing, even when wearing glasses/contacts?: No Does the patient have difficulty concentrating, remembering, or making decisions?: No Patient able to express need for assistance with ADLs?: Yes Does the patient have difficulty dressing or bathing?: No Independently performs ADLs?:  Yes (appropriate for developmental age) Does the patient have difficulty walking or climbing stairs?: No Weakness of Legs: Both Weakness of Arms/Hands: Both  Permission Sought/Granted                  Emotional Assessment Appearance:: Appears stated age Attitude/Demeanor/Rapport: Engaged, Gracious Affect (typically observed): Accepting, Appropriate, Calm, Pleasant Orientation: : Oriented to Self, Oriented to Place, Oriented to  Time, Oriented to Situation Alcohol / Substance Use: Not Applicable Psych Involvement: No (comment)  Admission diagnosis:  Acute respiratory failure (HCC) [J96.00] Dyspnea on exertion [R06.09] Acute respiratory failure with hypoxia (HCC) [J96.01] Chest pain, unspecified type [R07.9] Patient Active Problem List   Diagnosis Date Noted   Acute respiratory failure ([place]) 04/19/2021   Hypertension    Elevated troponin    Alcohol dependence ([place])    Nicotine dependence    COPD with acute exacerbation ([place])    PCP:  Pcp, No Pharmacy:   [facility] [address] [place] [address] Phone: [phone] Fax: [phone]     Social Determinants of Health (SDOH) Interventions    Readmission Risk Interventions No flowsheet data found.

## 2021-04-19 NOTE — H&P (Signed)
History and Physical    Patient: Nicholas Chung INO:[id number] DOB: [birth year] DOA: 04/19/2021 DOS: the patient was seen and examined on 04/19/2021 PCP: Pcp, No  Patient coming from: Home  Chief Complaint:  Chief Complaint  Patient presents with   Shortness of Breath   Chest Pain    HPI: Lillard Bailon is a 56 y.o. male with medical history significant for hypertension, nicotine dependence and bladder cancer who presents to the ER for evaluation of exertional shortness of breath which he has had intermittently for 3 weeks. Patient notes that he has had shortness of breath mostly with exertion associated with chest pain.  Symptoms have been intermittent over the last 3 weeks but have become persistent in the last 3 days.  He is unable to walk short distances without having to stop to catch his breath.  He has a cough that is nonproductive as well as a headache but denies having any leg swelling, no fever, no chills, no orthopnea or PND.  He denies feeling dizzy or lightheaded. He denies having any abdominal pain, no changes in his bowel habits, no nausea, no vomiting, no myalgias, no diaphoresis, no palpitations, no focal deficits or blurred vision. Patient states that he recently moved to the area about a month ago and has not been on any of his medications. He was found to have room air pulse oximetry of 89% requiring oxygen supplementation to maintain pulse ox greater than 92%  Review of Systems: As mentioned in the history of present illness. All other systems reviewed and are negative. Past Medical History:  Diagnosis Date   Bladder cancer ([place])    Hypertension    Past Surgical History:  Procedure Laterality Date   REPLACEMENT TOTAL KNEE Left    Social History:  reports that he has been smoking cigarettes. He has been smoking an average of 1 pack per day. He has never used smokeless tobacco. He reports current alcohol use. He reports that he does not use drugs.  No Known  Allergies  History reviewed. No pertinent family history.  Prior to Admission medications   Not on File    Physical Exam: Vitals:   04/19/21 0630 04/19/21 0645 04/19/21 0700 04/19/21 0750  BP:    (!) 161/102  Pulse:    (!) 102  Resp: (!) 26 (!) 38 (!) 25 (!) 29  Temp:      TempSrc:      SpO2: 98% 96%  94%  Weight:      Height:       Physical Exam Constitutional:      Appearance: He is obese.  HENT:     Head: Normocephalic and atraumatic.     Mouth/Throat:     Mouth: Mucous membranes are moist.  Eyes:     Pupils: Pupils are equal, round, and reactive to light.  Cardiovascular:     Rate and Rhythm: Normal rate and regular rhythm.  Pulmonary:     Effort: Pulmonary effort is normal.     Breath sounds: Examination of the right-upper field reveals wheezing. Examination of the left-upper field reveals wheezing. Wheezing present.  Abdominal:     General: Bowel sounds are normal.     Palpations: Abdomen is soft.  Musculoskeletal:        General: Normal range of motion.     Cervical back: Normal range of motion and neck supple.  Skin:    General: Skin is warm and dry.  Neurological:     General: No focal deficit  present.     Mental Status: He is alert and oriented to person, place, and time.  Psychiatric:        Mood and Affect: Mood normal.        Behavior: Behavior normal.     Data Reviewed: Notes from primary care and specialist visits, past discharge summaries. Prior diagnostic testing as applicable to current admission diagnoses Updated medications and problem lists for reconciliation ED course, including vitals, labs, imaging, treatment and response to treatment Triage notes and ED providers notes Arterial blood gas on 2 L of oxygen reviewed and patient has hypoxia with PO2 of 74.  pH is compensated. Troponin 61 >> 57, BNP 233, alcohol level 108, white count of 14.8 Respiratory viral panel is negative Chest x-ray reviewed by me shows no evidence of acute  cardiopulmonary disease Twelve-lead EKG reviewed by me shows sinus tachycardia with left anterior fascicular block  There are no new results to review at this time.  Assessment and Plan: Principal Problem:   Acute respiratory failure (HCC) Active Problems:   Hypertension   Elevated troponin   Alcohol dependence (HCC)   Nicotine dependence   COPD with acute exacerbation (HCC)    COPD with acute exacerbation Place patient on scheduled and as needed bronchodilators Place patient on systemic steroids Continue oxygen supplementation to maintain pulse oximetry greater than 92%    Acute respiratory failure Secondary to acute COPD exacerbation Patient presented to the ER for evaluation of exertional shortness of breath and was found to be tachypneic Upon arrival to the ER his room air pulse oximetry was 89% requiring oxygen supplementation to maintain pulse oximetry greater than 92% We will attempt to wean off oxygen once acute illness has improved    Elevated troponin Probably secondary to demand ischemia from COPD exacerbation We will cycle cardiac enzymes Place patient on aspirin 81 mg daily Obtain 2D echocardiogram to assess LVEF and rule out regional wall motion abnormality Consult cardiology     Nicotine dependence Smoking cessation has been discussed with patient in detail He declines a nicotine transdermal patch at this time     Alcohol dependence Patient admits to daily alcohol use but denies having symptoms of alcohol withdrawal when he does not drink We will place patient on CIWA protocol and administer lorazepam for CIWA score of 8 or greater     Hypertension Uncontrolled secondary to medication noncompliance We will start patient on amlodipine 5 mg daily   Advance Care Planning:   Code Status: Full Code   Consults: Cardiology  Family Communication: Greater than 50% of time was spent discussing patient's condition and plan of care with him at the  bedside.  All questions and concerns have been addressed.  He verbalizes understanding and agrees with the plan  Severity of Illness: The appropriate patient status for this patient is OBSERVATION. Observation status is judged to be reasonable and necessary in order to provide the required intensity of service to ensure the patient's safety. The patient's presenting symptoms, physical exam findings, and initial radiographic and laboratory data in the context of their medical condition is felt to place them at decreased risk for further clinical deterioration. Furthermore, it is anticipated that the patient will be medically stable for discharge from the hospital within 2 midnights of admission.   Author: Collier Bullock, MD 04/19/2021 8:46 AM  For on call review [email or web address].

## 2021-04-20 DIAGNOSIS — R079 Chest pain, unspecified: Secondary | ICD-10-CM

## 2021-04-20 DIAGNOSIS — R918 Other nonspecific abnormal finding of lung field: Secondary | ICD-10-CM

## 2021-04-20 DIAGNOSIS — J441 Chronic obstructive pulmonary disease with (acute) exacerbation: Secondary | ICD-10-CM | POA: Diagnosis present

## 2021-04-20 DIAGNOSIS — I5042 Chronic combined systolic (congestive) and diastolic (congestive) heart failure: Secondary | ICD-10-CM

## 2021-04-20 DIAGNOSIS — R911 Solitary pulmonary nodule: Secondary | ICD-10-CM

## 2021-04-20 DIAGNOSIS — I159 Secondary hypertension, unspecified: Secondary | ICD-10-CM

## 2021-04-20 DIAGNOSIS — I420 Dilated cardiomyopathy: Secondary | ICD-10-CM

## 2021-04-20 LAB — BASIC METABOLIC PANEL
Anion gap: 5 (ref 5–15)
BUN: 23 mg/dL — ABNORMAL HIGH (ref 6–20)
CO2: 25 mmol/L (ref 22–32)
Calcium: 8.7 mg/dL — ABNORMAL LOW (ref 8.9–10.3)
Chloride: 107 mmol/L (ref 98–111)
Creatinine, Ser: 0.97 mg/dL (ref 0.61–1.24)
GFR, Estimated: >60 mL/min (ref >60)
Glucose, Bld: 188 mg/dL — ABNORMAL HIGH (ref 70–99)
Potassium: 3.7 mmol/L (ref 3.5–5.1)
Sodium: 137 mmol/L (ref 135–145)

## 2021-04-20 LAB — CBC
HCT: 42.3 % (ref 39.0–52.0)
Hemoglobin: 13.2 g/dL (ref 13.0–17.0)
MCH: 27.3 pg (ref 26.0–34.0)
MCHC: 31.2 g/dL (ref 30.0–36.0)
MCV: 87.6 fL (ref 80.0–100.0)
Platelets: 283 10*3/uL (ref 150–400)
RBC: 4.83 MIL/uL (ref 4.22–5.81)
RDW: 16.3 % — ABNORMAL HIGH (ref 11.5–15.5)
WBC: 19.5 10*3/uL — ABNORMAL HIGH (ref 4.0–10.5)
nRBC: 0 % (ref 0.0–0.2)

## 2021-04-20 LAB — PROCALCITONIN: Procalcitonin: 0.16 ng/mL

## 2021-04-20 LAB — HEMOGLOBIN A1C
Hgb A1c MFr Bld: 5.6 % (ref 4.8–5.6)
Mean Plasma Glucose: 114.02 mg/dL

## 2021-04-20 MED ORDER — IPRATROPIUM-ALBUTEROL 0.5-2.5 (3) MG/3ML IN SOLN
3.0000 mL | Freq: Three times a day (TID) | RESPIRATORY_TRACT | Status: DC
Start: 2021-04-20 — End: 2021-04-20
  Administered 2021-04-20: 3 mL via RESPIRATORY_TRACT
  Filled 2021-04-20: qty 3

## 2021-04-20 MED ORDER — BUDESONIDE 0.5 MG/2ML IN SUSP
0.5000 mg | Freq: Two times a day (BID) | RESPIRATORY_TRACT | Status: DC
  Administered 2021-04-20 – 2021-04-21 (×3): 0.5 mg via RESPIRATORY_TRACT
  Filled 2021-04-20 (×3): qty 2

## 2021-04-20 MED ORDER — ALUM & MAG HYDROXIDE-SIMETH 200-200-20 MG/5ML PO SUSP
30.0000 mL | Freq: Four times a day (QID) | ORAL | Status: DC | PRN
  Administered 2021-04-20 (×2): 30 mL via ORAL
  Filled 2021-04-20 (×2): qty 30

## 2021-04-20 MED ORDER — SPIRONOLACTONE 25 MG PO TABS
25.0000 mg | ORAL_TABLET | Freq: Every day | ORAL | Status: DC
  Administered 2021-04-20 – 2021-04-21 (×2): 25 mg via ORAL
  Filled 2021-04-20 (×2): qty 1

## 2021-04-20 MED ORDER — LOSARTAN POTASSIUM 50 MG PO TABS
100.0000 mg | ORAL_TABLET | Freq: Every day | ORAL | Status: DC
  Administered 2021-04-21: 100 mg via ORAL
  Filled 2021-04-20: qty 2

## 2021-04-20 MED ORDER — LOSARTAN POTASSIUM 50 MG PO TABS
50.0000 mg | ORAL_TABLET | Freq: Once | ORAL | Status: AC
  Administered 2021-04-20: 50 mg via ORAL
  Filled 2021-04-20: qty 1

## 2021-04-20 MED ORDER — CARVEDILOL 6.25 MG PO TABS
3.1250 mg | ORAL_TABLET | Freq: Two times a day (BID) | ORAL | Status: DC
  Administered 2021-04-20: 3.125 mg via ORAL
  Filled 2021-04-20: qty 1

## 2021-04-20 MED ORDER — ATORVASTATIN CALCIUM 20 MG PO TABS
40.0000 mg | ORAL_TABLET | Freq: Every day | ORAL | Status: DC
  Administered 2021-04-20 – 2021-04-21 (×2): 40 mg via ORAL
  Filled 2021-04-20 (×2): qty 2

## 2021-04-20 MED ORDER — DOXYCYCLINE HYCLATE 100 MG PO TABS
100.0000 mg | ORAL_TABLET | Freq: Two times a day (BID) | ORAL | Status: DC
  Administered 2021-04-20 – 2021-04-21 (×3): 100 mg via ORAL
  Filled 2021-04-20 (×3): qty 1

## 2021-04-20 MED ORDER — SENNOSIDES-DOCUSATE SODIUM 8.6-50 MG PO TABS
1.0000 | ORAL_TABLET | Freq: Every evening | ORAL | Status: DC | PRN
Start: 2021-04-20 — End: 2021-04-21

## 2021-04-20 MED ORDER — METHYLPREDNISOLONE SODIUM SUCC 40 MG IJ SOLR
40.0000 mg | Freq: Two times a day (BID) | INTRAMUSCULAR | Status: DC
  Administered 2021-04-20 – 2021-04-21 (×2): 40 mg via INTRAVENOUS
  Filled 2021-04-20 (×2): qty 1

## 2021-04-20 MED ORDER — GUAIFENESIN 100 MG/5ML PO LIQD
5.0000 mL | ORAL | Status: DC | PRN
Start: 2021-04-20 — End: 2021-04-21

## 2021-04-20 MED ORDER — NICOTINE 21 MG/24HR TD PT24: 21.0000 mg | MEDICATED_PATCH | Freq: Every day | TRANSDERMAL | Status: DC | PRN

## 2021-04-20 MED ORDER — CARVEDILOL 12.5 MG PO TABS
12.5000 mg | ORAL_TABLET | Freq: Two times a day (BID) | ORAL | Status: DC
  Administered 2021-04-20 – 2021-04-21 (×3): 12.5 mg via ORAL
  Filled 2021-04-20 (×3): qty 1

## 2021-04-20 MED ORDER — TRAZODONE HCL 50 MG PO TABS
50.0000 mg | ORAL_TABLET | Freq: Every evening | ORAL | Status: DC | PRN
  Administered 2021-04-20: 50 mg via ORAL
  Filled 2021-04-20: qty 1

## 2021-04-20 MED ORDER — DM-GUAIFENESIN ER 30-600 MG PO TB12
1.0000 | ORAL_TABLET | Freq: Two times a day (BID) | ORAL | Status: DC
  Administered 2021-04-20 – 2021-04-21 (×3): 1 via ORAL
  Filled 2021-04-20 (×3): qty 1

## 2021-04-20 NOTE — Progress Notes (Signed)
Progress Note  Patient Name: Nicholas Chung Date of Encounter: 04/20/2021  Primary Cardiologist: Kathlyn Sacramento, MD  Subjective   Breathing much improved.  No c/p or shortness of breath.  Not interested in pursuing R & L heart cath next week.  Inpatient Medications    Scheduled Meds:  amLODipine  5 mg Oral Daily   aspirin EC  81 mg Oral Daily   budesonide  0.5 mg Nebulization BID   dextromethorphan-guaiFENesin  1 tablet Oral BID   enoxaparin (LOVENOX) injection  0.5 mg/kg Subcutaneous T41D   folic acid  1 mg Oral Daily   ipratropium-albuterol  3 mL Nebulization TID   LORazepam  0-4 mg Intravenous Q6H   Followed by   Derrill Memo ON 04/21/2021] LORazepam  0-4 mg Intravenous Q12H   losartan  25 mg Oral Daily   multivitamin with minerals  1 tablet Oral Daily   predniSONE  40 mg Oral Q breakfast   sodium chloride flush  3 mL Intravenous Q12H   thiamine  100 mg Oral Daily   Or   thiamine  100 mg Intravenous Daily   Continuous Infusions:  sodium chloride     PRN Meds: sodium chloride, acetaminophen **OR** acetaminophen, alum & mag hydroxide-simeth, guaiFENesin, ipratropium-albuterol, LORazepam **OR** LORazepam, nitroGLYCERIN, ondansetron **OR** ondansetron (ZOFRAN) IV, senna-docusate, sodium chloride flush, traZODone   Vital Signs    Vitals:   04/19/21 1406 04/19/21 1455 04/20/21 0604 04/20/21 0746  BP: (!) 147/86 (!) 171/105 (!) 149/100 (!) 142/93  Pulse: 88 86 91 91  Resp: 20 19 20 20   Temp: (!) 97.5 F (36.4 C) (!) 97.5 F (36.4 C) 97.8 F (36.6 C) 97.8 F (36.6 C)  TempSrc: Oral Oral Oral Oral  SpO2: 92% 95% 98% 96%  Weight:      Height:        Intake/Output Summary (Last 24 hours) at 04/20/2021 0850 Last data filed at 04/20/2021 6222 Gross per 24 hour  Intake 120 ml  Output 0 ml  Net 120 ml   Filed Weights   04/19/21 0246  Weight: 106.6 kg    Physical Exam   GEN: Well nourished, well developed, in no acute distress.  HEENT: Grossly normal.  Neck: Supple,  no JVD, carotid bruits, or masses. Cardiac: RRR, 2/6 blowing quality murmur @ the apex, no rubs or gallops. No clubbing, cyanosis, edema.  Radials 2+, DP/PT 2+ and equal bilaterally.  Respiratory:  Respirations regular and unlabored, diminished breath sounds bilat. GI: Soft, nontender, nondistended, BS + x 4. MS: no deformity or atrophy. Skin: warm and dry, no rash. Neuro:  Strength and sensation are intact. Psych: AAOx3.  Normal affect.  Labs    Chemistry Recent Labs  Lab 04/19/21 0250 04/20/21 0257  NA 144 137  K 3.5 3.7  CL 110 107  CO2 26 25  GLUCOSE 124* 188*  BUN 20 23*  CREATININE 1.14 0.97  CALCIUM 8.7* 8.7*  GFRNONAA >60 >60  ANIONGAP 8 5     Hematology Recent Labs  Lab 04/19/21 0250 04/20/21 0257  WBC 14.8* 19.5*  RBC 5.07 4.83  HGB 13.9 13.2  HCT 44.2 42.3  MCV 87.2 87.6  MCH 27.4 27.3  MCHC 31.4 31.2  RDW 16.9* 16.3*  PLT 290 283    Cardiac Enzymes  Recent Labs  Lab 04/19/21 0250 04/19/21 0451 04/19/21 0911 04/19/21 1500  TROPONINIHS 61* 57* 52* 42*      BNP Recent Labs  Lab 04/19/21 0250  BNP 233.5*  Radiology    DG Chest 2 View  Result Date: 04/19/2021 CLINICAL DATA:  Shortness of breath and chest pain EXAM: CHEST - 2 VIEW COMPARISON:  None. FINDINGS: The heart size and mediastinal contours are within normal limits. Both lungs are clear. The visualized skeletal structures are unremarkable. IMPRESSION: No active cardiopulmonary disease. Electronically Signed   By: Ulyses Jarred M.D.   On: 04/19/2021 03:12   CT Angio Chest PE W and/or Wo Contrast  Result Date: 04/19/2021 CLINICAL DATA:  Chest pain, shortness of breath. EXAM: CT ANGIOGRAPHY CHEST WITH CONTRAST TECHNIQUE: Multidetector CT imaging of the chest was performed using the standard protocol during bolus administration of intravenous contrast. Multiplanar CT image reconstructions and MIPs were obtained to evaluate the vascular anatomy. RADIATION DOSE REDUCTION: This exam was  performed according to the departmental dose-optimization program which includes automated exposure control, adjustment of the mA and/or kV according to patient size and/or use of iterative reconstruction technique. CONTRAST:  162mL OMNIPAQUE IOHEXOL 350 MG/ML SOLN COMPARISON:  Radiograph of same day. FINDINGS: Cardiovascular: There is no evidence of large central pulmonary embolus. However, due to persistent respiratory motion artifact, smaller and more peripheral pulmonary emboli in the lower lobe branches bilaterally cannot be excluded on the basis of this exam. Normal cardiac size. No pericardial effusion. Mediastinum/Nodes: No enlarged mediastinal, hilar, or axillary lymph nodes. Thyroid gland, trachea, and esophagus demonstrate no significant findings. Lungs/Pleura: No pneumothorax or pleural effusion is noted. 13 x 10 mm nodular density is noted laterally in the right lower lobe best seen on image number 67 of series 6. Left lung is unremarkable. Upper Abdomen: No acute abnormality. Musculoskeletal: No chest wall abnormality. No acute or significant osseous findings. Review of the MIP images confirms the above findings. IMPRESSION: There is no evidence of large central pulmonary embolus. However, due to persistent respiratory motion artifact, smaller and more peripheral pulmonary emboli in the lower lobe branches bilaterally cannot be excluded on the basis of this exam. 13 x 10 mm nodular density noted laterally in the right lower lobe. Consider one of the following in 3 months for both low-risk and high-risk individuals: (a) repeat chest CT, (b) follow-up PET-CT, or (c) tissue sampling. This recommendation follows the consensus statement: Guidelines for Management of Incidental Pulmonary Nodules Detected on CT Images: From the Fleischner Society 2017; Radiology 2017; 284:228-243. Electronically Signed   By: Marijo Conception M.D.   On: 04/19/2021 05:30   Telemetry    RSR - Personally Reviewed  Cardiac  Studies   2D Echocardiogram 02.17.2023   1. Left ventricular ejection fraction, by estimation, is 40 to 45%. The  left ventricle has mildly decreased function. The left ventricle  demonstrates mild global hypokinesis with severe apical hypokinesis. Left  ventricular diastolic parameters are  consistent with Grade II diastolic dysfunction (pseudonormalization). The  average left ventricular global longitudinal strain is -13.2 %. The global  longitudinal strain is abnormal.   2. Right ventricular systolic function is normal. The right ventricular  size is normal. Tricuspid regurgitation signal is inadequate for assessing  PA pressure.   3. Left atrial size was mildly dilated.   4. The mitral valve is normal in structure. Moderate mitral valve  regurgitation. No evidence of mitral stenosis.   5. The aortic valve was not well visualized. Aortic valve regurgitation  is not visualized. No aortic stenosis is present.   6. The inferior vena cava is normal in size with greater than 50%  respiratory variability, suggesting right atrial pressure  of 3 mmHg.  _____________   Patient Profile     56 y.o. male w/ a h/o HTN, tob abuse, etoh abuse, and bladder cancer, who was admitted 2/17 w/ progressive exertional dyspnea, intermittent chest tightness, and cough, and found have LV dysfunction (EF 40-45%).  Assessment & Plan    1.  Demand Ischemia:  Presented w/ progressive dyspnea and intermittent chest tightness.  HsTrop mildly elevated w/ flat trend @ 61  57  52  42.  No c/p this AM.  Discussed role of R & L heart cath in the eval of c/p and newly dx cardiomyopathy w/ moderate mitral regurgitation.  At this time he is not interested in further eval, and prefers conservative medical rx w/ outpt f/u.  Cont asa.  Adding ? blocker and statin.  F/u lipids.  2.  Acute combined syst/diastolic CHF; Cardiomyopathy:  No prior h/o CHF/cardiomyopathy.  Echo w/ midrange EF of 40-45% w/ mild glob HK and severe  apical HK, GrII DD, nl RV, mod MR. Euvolemic on exam.  Cont ARB.  Adding carvedilol.  D/c amlodipine and add spironolactone.  As above, pt not interested in R&L heart cath @ this time. Does not have insurance, so prefers generics - won't transition ARB to entresto and will hold off on SGLT2i.  3.  Acute resp failure:  Multifactorial in setting of cardiomyoapthy and COPD/tob abuse. No wheezing this AM.  Steroids/nebs per IM.  Euvolemic on exam.  4.  Essential HTN:  Untreated in outpt setting.  BPs trending 140's/90's.  Cont losartan.  With reduced EF, will add carvedilol and spiro and d/c amlodipine.  5.  ETOH abuse:  Heavy user - pint of liquor every other day.  Likely contributing to cardiomyopathy.  Complete cessation advised.  6.  Tob abuse:  Cessation advised.  7.  OSA:  untreated.  Will need outpt f/u and sleep eval.  Lack of insurance likely to delay w/u.  8.  RLL pulmonary nodule:  H/o tob  At a minimum needs repeat chest CT in 3 mos.  Signed, Murray Hodgkins, NP  04/20/2021, 8:50 AM    For questions or updates, please contact   Please consult [email or web address] for contact info under Cardiology/STEMI.

## 2021-04-20 NOTE — Assessment & Plan Note (Addendum)
Counseled to quit using this.  He is not showing any signs of withdrawal

## 2021-04-20 NOTE — Assessment & Plan Note (Addendum)
Echocardiogram had shown EF of 45% with grade 2 diastolic dysfunction.  Has apical wall hypokinesia.  Patient was offered RHC/LHC but he declined. Cardiology team following-started on aspirin, Lipitor, Coreg, ARB, Aldactone.  Prescriptions have been given for 30 days

## 2021-04-20 NOTE — Assessment & Plan Note (Addendum)
Likely secondary to COPD exacerbation.  Doing much better today.

## 2021-04-20 NOTE — Assessment & Plan Note (Signed)
Given his history of tobacco use, will need repeat CT chest outpatient in about 3 months.

## 2021-04-20 NOTE — Assessment & Plan Note (Addendum)
From demand ischemia.  Cardiology team following.  Ongoing work-up.  A1c 5.6, LDL 148.  Started on statin.

## 2021-04-20 NOTE — Assessment & Plan Note (Addendum)
Smokes about 1 pack of cigarettes per day for almost last 40 years. .  Nicotine patch as needed.  Counseled to quit smoking. Leukocytosis secondary to steroid use.  Will complete 5 days of oral doxycycline.  Albuterol and ipratropium inhaler has been prescribed, initially to use this inhaler every 6 hours for the next 2-3 days thereafter as needed.  Overall prednisone prescribed

## 2021-04-20 NOTE — Assessment & Plan Note (Addendum)
Started on Coreg, losartan and Aldactone.  This will need to be adjusted as necessary going forward.  Low-salt diet recommended

## 2021-04-20 NOTE — Evaluation (Signed)
Physical Therapy Evaluation Patient Details Name: Nicholas Chung MRN: [id number] DOB: [birth year] Today's Date: 04/20/2021  History of Present Illness  Pt is a 56 y.o. male presenting to hospital 2/17 with c/o chest pain and SOB.  Pt admitted with COPD with acute exacerbation, acute respiratory failure, and elevated troponin (anticipate d/t demand ischemia).  PMH includes htn, tobacco use, bladder CA in remission, L TKR.  Clinical Impression  Prior to hospital admission, pt was independent; lives with his wife and his mother in 1 level home.  Currently pt is independent with bed mobility, transfers, and ambulation 200 feet (no AD use).  Pt's HR 81-100 bpm and O2 sats 96% or greater on room air during sessions activities.  Mild SOB noted end of ambulation trial.  Pt did report feeling of "grogginess" type lightheadedness with mobility (nurse notified) but pt reporting anticipating d/t not walking much recently.  Pt appears to be at baseline level of functional mobility.  No acute PT needs identified and pt reporting no physical therapy related concerns.  Physical therapy will sign off at this time.  Please re-consult PT if pt's status changes and acute PT needs are identified.   Recommendations for follow up therapy are one component of a multi-disciplinary discharge planning process, led by the attending physician.  Recommendations may be updated based on patient status, additional functional criteria and insurance authorization.  Follow Up Recommendations No PT follow up    Assistance Recommended at Discharge None  Patient can return home with the following       Equipment Recommendations None recommended by PT  Recommendations for Other Services       Functional Status Assessment Patient has had a recent decline in their functional status and demonstrates the ability to make significant improvements in function in a reasonable and predictable amount of time.     Precautions / Restrictions  Precautions Precautions: None Restrictions Weight Bearing Restrictions: No      Mobility  Bed Mobility Overal bed mobility: Independent             General bed mobility comments: Supine to sitting without any noted difficulties    Transfers Overall transfer level: Independent Equipment used: None               General transfer comment: steady safe transfers noted    Ambulation/Gait Ambulation/Gait assistance: Independent Gait Distance (Feet): 200 Feet Assistive device: None Gait Pattern/deviations: WFL(Within Functional Limits)       General Gait Details: steady ambulation  Stairs            Wheelchair Mobility    Modified Rankin (Stroke Patients Only)       Balance Overall balance assessment: Needs assistance Sitting-balance support: No upper extremity supported, Feet supported Sitting balance-Leahy Scale: Normal Sitting balance - Comments: steady sitting reaching outside BOS   Standing balance support: No upper extremity supported, During functional activity Standing balance-Leahy Scale: Normal Standing balance comment: no loss of balance with ambulation; steady standing reaching outside BOS                             Pertinent Vitals/Pain Pain Assessment Pain Assessment: No/denies pain    Home Living Family/patient expects to be discharged to:: Private residence Living Arrangements: Spouse/significant other;Other (Comment) (pt's mother) Available Help at Discharge: Family Type of Home: House Home Access: Stairs to enter Entrance Stairs-Rails: Psychiatric nurse of Steps: 3   Home Layout: One level Home  Equipment: Grab bars - tub/shower (borrowed his mom's cane when he came to hospital)      Prior Function Prior Level of Function : Independent/Modified Independent             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  Active.  No recent falls.       Hand Dominance        Extremity/Trunk  Assessment   Upper Extremity Assessment Upper Extremity Assessment: Overall WFL for tasks assessed    Lower Extremity Assessment Lower Extremity Assessment: Overall WFL for tasks assessed (at least 3/5 AROM hip flexion, knee flexion/extension, and DF/PF)    Cervical / Trunk Assessment Cervical / Trunk Assessment: Normal  Communication   Communication: No difficulties  Cognition Arousal/Alertness: Awake/alert Behavior During Therapy: WFL for tasks assessed/performed Overall Cognitive Status: Within Functional Limits for tasks assessed                                          General Comments  Nursing cleared pt for participation in physical therapy.  Pt agreeable to PT session.     Exercises     Assessment/Plan    PT Assessment Patient does not need any further PT services  PT Problem List         PT Treatment Interventions      PT Goals (Current goals can be found in the Care Plan section)  Acute Rehab PT Goals Patient Stated Goal: to go home PT Goal Formulation: With patient Time For Goal Achievement: 05/04/21 Potential to Achieve Goals: Good    Frequency       Co-evaluation               AM-PAC PT "6 Clicks" Mobility  Outcome Measure Help needed turning from your back to your side while in a flat bed without using bedrails?: None Help needed moving from lying on your back to sitting on the side of a flat bed without using bedrails?: None Help needed moving to and from a bed to a chair (including a wheelchair)?: None Help needed standing up from a chair using your arms (e.g., wheelchair or bedside chair)?: None Help needed to walk in hospital room?: None Help needed climbing 3-5 steps with a railing? : None 6 Click Score: 24    End of Session   Activity Tolerance: Patient tolerated treatment well Patient left: in chair;with call bell/phone within reach Nurse Communication: Mobility status;Other (comment) (pt's vitals and symptoms  during session) PT Visit Diagnosis: Difficulty in walking, not elsewhere classified (R26.2)    Time: 4098-1191 PT Time Calculation (min) (ACUTE ONLY): 15 min   Charges:   PT Evaluation $PT Eval Low Complexity: 1 Low         Nicholas Chung, PT 04/20/21, 12:32 PM

## 2021-04-20 NOTE — Plan of Care (Signed)
°  Problem: Activity: Goal: Ability to tolerate increased activity will improve Outcome: Progressing Goal: Will verbalize the importance of balancing activity with adequate rest periods Outcome: Progressing   Problem: Respiratory: Goal: Ability to maintain a clear airway will improve Outcome: Progressing Goal: Levels of oxygenation will improve Outcome: Progressing Goal: Ability to maintain adequate ventilation will improve Outcome: Progressing   Problem: Education: Goal: Knowledge of General Education information will improve Description: Including pain rating scale, medication(s)/side effects and non-pharmacologic comfort measures Outcome: Progressing   Problem: Activity: Goal: Risk for activity intolerance will decrease Outcome: Progressing   Problem: Nutrition: Goal: Adequate nutrition will be maintained Outcome: Progressing   Problem: Coping: Goal: Level of anxiety will decrease Outcome: Progressing   Problem: Elimination: Goal: Will not experience complications related to bowel motility Outcome: Progressing Goal: Will not experience complications related to urinary retention Outcome: Progressing   Problem: Pain Managment: Goal: General experience of comfort will improve Outcome: Progressing   Problem: Safety: Goal: Ability to remain free from injury will improve Outcome: Progressing   Problem: Skin Integrity: Goal: Risk for impaired skin integrity will decrease Outcome: Progressing

## 2021-04-20 NOTE — Assessment & Plan Note (Signed)
Nicotine patch as needed °

## 2021-04-20 NOTE — Progress Notes (Signed)
OT Cancellation Note  Patient Details Name: Nicholas Chung MRN: [id number] DOB: Aug 18, 1965   Cancelled Treatment:    Reason Eval/Treat Not Completed: OT screened, no needs identified, will sign off Upon chart review and in speaking with PT, no acute OT needs detected as pt appears to be at his functional baseline. Will complete order. Please re-consult should new needs arise.   Gerrianne Scale, [place], OTR/L ascom [phone] 04/20/21, 2:08 PM

## 2021-04-20 NOTE — Progress Notes (Signed)
PROGRESS NOTE    Nicholas Chung  IAX:[id number] DOB: Mar 19, 1965 DOA: 04/19/2021 PCP: Pcp, No   Brief Narrative:  56 year old with history of HTN, tobacco use, medication noncompliance comes to the hospital with complaints of shortness of breath intermittently ongoing for 3 weeks.  This is slowly progressed during this time.  Upon admission he was found to be in COPD exacerbation with slightly elevated troponin.  He denied any chest pain.  He was started on COPD treatment-steroids, bronchodilators.  Cardiology was consulted, echocardiogram showed EF of 45% with grade 2 diastolic dysfunction, apical wall hypokinesia.  RHC/LHC was offered but patient declined.   Assessment & Plan:  Principal Problem:   Acute respiratory failure (HCC) Active Problems:   COPD with acute exacerbation (HCC)   Elevated troponin   Chronic combined systolic and diastolic CHF (congestive heart failure) (HCC)   Hypertension   Alcohol dependence (HCC)   Nicotine dependence   Pulmonary nodules     Assessment and Plan: * Acute respiratory failure ([place])- (present on admission) Likely secondary to COPD exacerbation.  Ongoing treatment.  COPD with acute exacerbation ([place])- (present on admission) Smokes about 1 pack of cigarettes per day for almost last 40 years.  At this time continue steroids, bronchodilators, I-S/flutter valve.  Nicotine patch as needed.  Counseled to quit smoking. Leukocytosis secondary to steroid use.  No need for antibiotic We will add empiric 5-day course of doxycycline. Will need outptn PFTs once recovered from currect exacerbation.   Chronic combined systolic and diastolic CHF (congestive heart failure) (HCC) Echocardiogram had shown EF of 45% with grade 2 diastolic dysfunction.  Has apical wall hypokinesia.  Patient would really benefit from RHC/LHC but currently declining this to be performed inpatient. Cardiology team following-started on aspirin, Lipitor, Coreg, ARB, Aldactone. Check  A1c, lipid panel.   Elevated troponin- (present on admission) From demand ischemia.  Cardiology team following.  Ongoing work-up.  Check lipid panel, A1c.  Pulmonary nodules Given his history of tobacco use, will need repeat CT chest outpatient in about 3 months.  Nicotine dependence- (present on admission) Nicotine patch as needed  Alcohol dependence ([place])- (present on admission) Counseled to quit using this.  Alcohol withdrawal protocol in place.  Hypertension- (present on admission) Not necessarily compliant with his medications.  Therefore uncontrolled.  Cardiac medications being added          DVT prophylaxis:   Lovenox  Code Status: Full code Family Communication:    Currently ongoing treatment for COPD exacerbation.  He does get dyspnea with minimal exertion.  Ongoing cardiac medical management.    Nutritional status          Body mass index is 31.87 kg/m.           Subjective: Patient states that shortness of breath is little better this morning compared to yesterday.  Feels like he is able to take deeper breath.  Still has exertional dyspnea. Had an extensive discussion with the patient this morning regarding the benefits of performing RHC/LHC during this hospitalization given his risk factors.  Patient was at first agreeable but later declined for this management inpatient.   Examination:  General exam: Appears calm and comfortable  Respiratory system: Bilateral rhonchi Cardiovascular system: S1 & S2 heard, RRR. No JVD, murmurs, rubs, gallops or clicks. No pedal edema. Gastrointestinal system: Abdomen is nondistended, soft and nontender. No organomegaly or masses felt. Normal bowel sounds heard. Central nervous system: Alert and oriented. No focal neurological deficits. Extremities: Symmetric 5 x 5 power.  Skin: No rashes, lesions or ulcers Psychiatry: Judgement and insight appear normal. Mood & affect appropriate.      Objective: Vitals:   04/19/21 1455 04/20/21 0604 04/20/21 0746 04/20/21 1021  BP: (!) 171/105 (!) 149/100 (!) 142/93   Pulse: 86 91 91   Resp: 19 20 20    Temp: (!) 97.5 F (36.4 C) 97.8 F (36.6 C) 97.8 F (36.6 C)   TempSrc: Oral Oral Oral   SpO2: 95% 98% 96% 96%  Weight:      Height:        Intake/Output Summary (Last 24 hours) at 04/20/2021 1133 Last data filed at 04/20/2021 2841 Gross per 24 hour  Intake 120 ml  Output 0 ml  Net 120 ml   Filed Weights   04/19/21 0246  Weight: 106.6 kg     Data Reviewed:   CBC: Recent Labs  Lab 04/19/21 0250 04/20/21 0257  WBC 14.8* 19.5*  HGB 13.9 13.2  HCT 44.2 42.3  MCV 87.2 87.6  PLT 290 324   Basic Metabolic Panel: Recent Labs  Lab 04/19/21 0250 04/20/21 0257  NA 144 137  K 3.5 3.7  CL 110 107  CO2 26 25  GLUCOSE 124* 188*  BUN 20 23*  CREATININE 1.14 0.97  CALCIUM 8.7* 8.7*   GFR: Estimated Creatinine Clearance: 106 mL/min (by C-G formula based on SCr of 0.97 mg/dL). Liver Function Tests: No results for input(s): AST, ALT, ALKPHOS, BILITOT, PROT, ALBUMIN in the last 168 hours. No results for input(s): LIPASE, AMYLASE in the last 168 hours. No results for input(s): AMMONIA in the last 168 hours. Coagulation Profile: No results for input(s): INR, PROTIME in the last 168 hours. Cardiac Enzymes: No results for input(s): CKTOTAL, CKMB, CKMBINDEX, TROPONINI in the last 168 hours. BNP (last 3 results) No results for input(s): PROBNP in the last 8760 hours. HbA1C: No results for input(s): HGBA1C in the last 72 hours. CBG: No results for input(s): GLUCAP in the last 168 hours. Lipid Profile: No results for input(s): CHOL, HDL, LDLCALC, TRIG, CHOLHDL, LDLDIRECT in the last 72 hours. Thyroid Function Tests: No results for input(s): TSH, T4TOTAL, FREET4, T3FREE, THYROIDAB in the last 72 hours. Anemia Panel: No results for input(s): VITAMINB12, FOLATE, FERRITIN, TIBC, IRON, RETICCTPCT in the last 72  hours. Sepsis Labs: Recent Labs  Lab 04/20/21 0257  PROCALCITON 0.16    Recent Results (from the past 240 hour(s))  Resp Panel by RT-PCR (Flu A&B, Covid) Nasopharyngeal Swab     Status: None   Collection Time: 04/19/21  2:54 AM   Specimen: Nasopharyngeal Swab; Nasopharyngeal(NP) swabs in vial transport medium  Result Value Ref Range Status   SARS Coronavirus 2 by RT PCR NEGATIVE NEGATIVE Final    Comment: (NOTE) SARS-CoV-2 target nucleic acids are NOT DETECTED.  The SARS-CoV-2 RNA is generally detectable in upper respiratory specimens during the acute phase of infection. The lowest concentration of SARS-CoV-2 viral copies this assay can detect is 138 copies/mL. A negative result does not preclude SARS-Cov-2 infection and should not be used as the sole basis for treatment or other patient management decisions. A negative result may occur with  improper specimen collection/handling, submission of specimen other than nasopharyngeal swab, presence of viral mutation(s) within the areas targeted by this assay, and inadequate number of viral copies(<138 copies/mL). A negative result must be combined with clinical observations, patient history, and epidemiological information. The expected result is Negative.  Fact Sheet for Patients:  EntrepreneurPulse.com.au  Fact Sheet for Healthcare Providers:  IncredibleEmployment.be  This test is no t yet approved or cleared by the [place] and  has been authorized for detection and/or diagnosis of SARS-CoV-2 by FDA under an Emergency Use Authorization (EUA). This EUA will remain  in effect (meaning this test can be used) for the duration of the COVID-19 declaration under Section 564(b)(1) of the Act, 21 U.S.C.section 360bbb-3(b)(1), unless the authorization is terminated  or revoked sooner.       Influenza A by PCR NEGATIVE NEGATIVE Final   Influenza B by PCR NEGATIVE NEGATIVE Final     Comment: (NOTE) The Xpert Xpress SARS-CoV-2/FLU/RSV plus assay is intended as an aid in the diagnosis of influenza from Nasopharyngeal swab specimens and should not be used as a sole basis for treatment. Nasal washings and aspirates are unacceptable for Xpert Xpress SARS-CoV-2/FLU/RSV testing.  Fact Sheet for Patients: EntrepreneurPulse.com.au  Fact Sheet for Healthcare Providers: IncredibleEmployment.be  This test is not yet approved or cleared by the [place] FDA and has been authorized for detection and/or diagnosis of SARS-CoV-2 by FDA under an Emergency Use Authorization (EUA). This EUA will remain in effect (meaning this test can be used) for the duration of the COVID-19 declaration under Section 564(b)(1) of the Act, 21 U.S.C. section 360bbb-3(b)(1), unless the authorization is terminated or revoked.  Performed at [facility], [address] [place], [place] [address]          Radiology Studies: DG Chest 2 View  Result Date: 04/19/2021 CLINICAL DATA:  Shortness of breath and chest pain EXAM: CHEST - 2 VIEW COMPARISON:  None. FINDINGS: The heart size and mediastinal contours are within normal limits. Both lungs are clear. The visualized skeletal structures are unremarkable. IMPRESSION: No active cardiopulmonary disease. Electronically Signed   By: Ulyses Jarred M.D.   On: 04/19/2021 03:12   CT Angio Chest PE W and/or Wo Contrast  Result Date: 04/19/2021 CLINICAL DATA:  Chest pain, shortness of breath. EXAM: CT ANGIOGRAPHY CHEST WITH CONTRAST TECHNIQUE: Multidetector CT imaging of the chest was performed using the standard protocol during bolus administration of intravenous contrast. Multiplanar CT image reconstructions and MIPs were obtained to evaluate the vascular anatomy. RADIATION DOSE REDUCTION: This exam was performed according to the departmental dose-optimization program which includes automated exposure control,  adjustment of the mA and/or kV according to patient size and/or use of iterative reconstruction technique. CONTRAST:  141mL OMNIPAQUE IOHEXOL 350 MG/ML SOLN COMPARISON:  Radiograph of same day. FINDINGS: Cardiovascular: There is no evidence of large central pulmonary embolus. However, due to persistent respiratory motion artifact, smaller and more peripheral pulmonary emboli in the lower lobe branches bilaterally cannot be excluded on the basis of this exam. Normal cardiac size. No pericardial effusion. Mediastinum/Nodes: No enlarged mediastinal, hilar, or axillary lymph nodes. Thyroid gland, trachea, and esophagus demonstrate no significant findings. Lungs/Pleura: No pneumothorax or pleural effusion is noted. 13 x 10 mm nodular density is noted laterally in the right lower lobe best seen on image number 67 of series 6. Left lung is unremarkable. Upper Abdomen: No acute abnormality. Musculoskeletal: No chest wall abnormality. No acute or significant osseous findings. Review of the MIP images confirms the above findings. IMPRESSION: There is no evidence of large central pulmonary embolus. However, due to persistent respiratory motion artifact, smaller and more peripheral pulmonary emboli in the lower lobe branches bilaterally cannot be excluded on the basis of this exam. 13 x 10 mm nodular density noted laterally in the right lower lobe. Consider one of the  following in 3 months for both low-risk and high-risk individuals: (a) repeat chest CT, (b) follow-up PET-CT, or (c) tissue sampling. This recommendation follows the consensus statement: Guidelines for Management of Incidental Pulmonary Nodules Detected on CT Images: From the Fleischner Society 2017; Radiology 2017; 284:228-243. Electronically Signed   By: Marijo Conception M.D.   On: 04/19/2021 05:30   ECHOCARDIOGRAM COMPLETE  Result Date: 04/19/2021    ECHOCARDIOGRAM REPORT   Patient Name:   SHONDALE QUINLEY Date of Exam: 04/19/2021 Medical Rec #:  [id number]    Height:       72.0 in Accession #:    [id number]  Weight:       235.0 lb Date of Birth:  March 11, 1965  BSA:          2.282 m Patient Age:    54 years    BP:           171/105 mmHg Patient Gender: M           HR:           96 bpm. Exam Location:  [facility] Procedure: 2D Echo, Color Doppler, Cardiac Doppler and Strain Analysis Indications:     Elevated troponin  History:         Patient has no prior history of Echocardiogram examinations.                  Risk Factors:Hypertension and Current Smoker.  Sonographer:     Charmayne Sheer Referring Phys:  [id number] AGBATA Diagnosing Phys: Ida Rogue MD  Sonographer Comments: Global longitudinal strain was attempted. IMPRESSIONS  1. Left ventricular ejection fraction, by estimation, is 40 to 45%. The left ventricle has mildly decreased function. The left ventricle demonstrates mild global hypokinesis with severe apical hypokinesis. Left ventricular diastolic parameters are consistent with Grade II diastolic dysfunction (pseudonormalization). The average left ventricular global longitudinal strain is -13.2 %. The global longitudinal strain is abnormal.  2. Right ventricular systolic function is normal. The right ventricular size is normal. Tricuspid regurgitation signal is inadequate for assessing PA pressure.  3. Left atrial size was mildly dilated.  4. The mitral valve is normal in structure. Moderate mitral valve regurgitation. No evidence of mitral stenosis.  5. The aortic valve was not well visualized. Aortic valve regurgitation is not visualized. No aortic stenosis is present.  6. The inferior vena cava is normal in size with greater than 50% respiratory variability, suggesting right atrial pressure of 3 mmHg. FINDINGS  Left Ventricle: Left ventricular ejection fraction, by estimation, is 40 to 45%. The left ventricle has mildly decreased function. The left ventricle demonstrates global hypokinesis. The average left ventricular global longitudinal strain is -13.2 %. The  global longitudinal strain is abnormal. The left ventricular internal cavity size was normal in size. There is no left ventricular hypertrophy. Left ventricular diastolic parameters are consistent with Grade II diastolic dysfunction (pseudonormalization). Right Ventricle: The right ventricular size is normal. No increase in right ventricular wall thickness. Right ventricular systolic function is normal. Tricuspid regurgitation signal is inadequate for assessing PA pressure. Left Atrium: Left atrial size was mildly dilated. Right Atrium: Right atrial size was normal in size. Pericardium: There is no evidence of pericardial effusion. Mitral Valve: The mitral valve is normal in structure. There is mild thickening of the mitral valve leaflet(s). Moderate mitral valve regurgitation. No evidence of mitral valve stenosis. MV peak gradient, 8.6 mmHg. The mean mitral valve gradient is 4.0 mmHg. Tricuspid Valve: The tricuspid valve is normal  in structure. Tricuspid valve regurgitation is mild . No evidence of tricuspid stenosis. Aortic Valve: The aortic valve was not well visualized. Aortic valve regurgitation is not visualized. No aortic stenosis is present. Aortic valve mean gradient measures 9.0 mmHg. Aortic valve peak gradient measures 18.0 mmHg. Aortic valve area, by VTI measures 1.85 cm. Pulmonic Valve: The pulmonic valve was normal in structure. Pulmonic valve regurgitation is not visualized. No evidence of pulmonic stenosis. Aorta: The aortic root is normal in size and structure. Venous: The inferior vena cava is normal in size with greater than 50% respiratory variability, suggesting right atrial pressure of 3 mmHg. IAS/Shunts: No atrial level shunt detected by color flow Doppler.  LEFT VENTRICLE PLAX 2D LVIDd:         5.98 cm      Diastology LVIDs:         4.58 cm      LV e' medial:    7.51 cm/s LV PW:         0.97 cm      LV E/e' medial:  15.3 LV IVS:        1.14 cm      LV e' lateral:   4.90 cm/s LVOT diam:      1.90 cm      LV E/e' lateral: 23.5 LV SV:         60 LV SV Index:   26           2D Longitudinal Strain LVOT Area:     2.84 cm     2D Strain GLS Avg:     -13.2 %  LV Volumes (MOD) LV vol d, MOD A2C: 219.0 ml LV vol d, MOD A4C: 220.0 ml LV vol s, MOD A2C: 134.0 ml LV vol s, MOD A4C: 119.0 ml LV SV MOD A2C:     85.0 ml LV SV MOD A4C:     220.0 ml LV SV MOD BP:      92.2 ml RIGHT VENTRICLE RV Basal diam:  2.85 cm LEFT ATRIUM             Index        RIGHT ATRIUM           Index LA diam:        4.70 cm 2.06 cm/m   RA Area:     13.10 cm LA Vol (A2C):   50.5 ml 22.13 ml/m  RA Volume:   24.80 ml  10.87 ml/m LA Vol (A4C):   61.2 ml 26.82 ml/m LA Biplane Vol: 58.0 ml 25.42 ml/m  AORTIC VALVE                     PULMONIC VALVE AV Area (Vmax):    1.69 cm      PV Vmax:       1.07 m/s AV Area (Vmean):   1.82 cm      PV Vmean:      72.000 cm/s AV Area (VTI):     1.85 cm      PV VTI:        0.212 m AV Vmax:           212.00 cm/s   PV Peak grad:  4.6 mmHg AV Vmean:          138.000 cm/s  PV Mean grad:  2.0 mmHg AV VTI:            0.321 m AV Peak Grad:  18.0 mmHg AV Mean Grad:      9.0 mmHg LVOT Vmax:         126.00 cm/s LVOT Vmean:        88.600 cm/s LVOT VTI:          0.210 m LVOT/AV VTI ratio: 0.65  AORTA Ao Root diam: 3.30 cm MITRAL VALVE MV Area (PHT): 5.46 cm     SHUNTS MV Area VTI:   2.36 cm     Systemic VTI:  0.21 m MV Peak grad:  8.6 mmHg     Systemic Diam: 1.90 cm MV Mean grad:  4.0 mmHg MV Vmax:       1.47 m/s MV Vmean:      93.0 cm/s MV Decel Time: 139 msec MV E velocity: 115.00 cm/s MV A velocity: 83.70 cm/s MV E/A ratio:  1.37 Ida Rogue MD Electronically signed by Ida Rogue MD Signature Date/Time: 04/19/2021/6:21:21 PM    Final         Scheduled Meds:  aspirin EC  81 mg Oral Daily   atorvastatin  40 mg Oral Daily   budesonide  0.5 mg Nebulization BID   carvedilol  12.5 mg Oral BID WC   dextromethorphan-guaiFENesin  1 tablet Oral BID   doxycycline  100 mg Oral Q12H   enoxaparin  (LOVENOX) injection  0.5 mg/kg Subcutaneous Z30Q   folic acid  1 mg Oral Daily   LORazepam  0-4 mg Intravenous Q6H   Followed by   Derrill Memo ON 04/21/2021] LORazepam  0-4 mg Intravenous Q12H   [START ON 04/21/2021] losartan  100 mg Oral Daily   losartan  50 mg Oral Once   multivitamin with minerals  1 tablet Oral Daily   predniSONE  40 mg Oral Q breakfast   sodium chloride flush  3 mL Intravenous Q12H   spironolactone  25 mg Oral Daily   thiamine  100 mg Oral Daily   Or   thiamine  100 mg Intravenous Daily   Continuous Infusions:  sodium chloride       LOS: 0 days   Time spent= 35 mins    Summer Parthasarathy Arsenio Loader, MD [facility]  If 7PM-7AM, please contact night-coverage  04/20/2021, 11:33 AM

## 2021-04-21 DIAGNOSIS — F1029 Alcohol dependence with unspecified alcohol-induced disorder: Secondary | ICD-10-CM

## 2021-04-21 DIAGNOSIS — I110 Hypertensive heart disease with heart failure: Secondary | ICD-10-CM

## 2021-04-21 LAB — COMPREHENSIVE METABOLIC PANEL
ALT: 25 U/L (ref 0–44)
AST: 22 U/L (ref 15–41)
Albumin: 3.2 g/dL — ABNORMAL LOW (ref 3.5–5.0)
Alkaline Phosphatase: 75 U/L (ref 38–126)
Anion gap: 8 (ref 5–15)
BUN: 27 mg/dL — ABNORMAL HIGH (ref 6–20)
CO2: 25 mmol/L (ref 22–32)
Calcium: 8.3 mg/dL — ABNORMAL LOW (ref 8.9–10.3)
Chloride: 104 mmol/L (ref 98–111)
Creatinine, Ser: 0.92 mg/dL (ref 0.61–1.24)
GFR, Estimated: >60 mL/min (ref >60)
Glucose, Bld: 155 mg/dL — ABNORMAL HIGH (ref 70–99)
Potassium: 4.6 mmol/L (ref 3.5–5.1)
Sodium: 137 mmol/L (ref 135–145)
Total Bilirubin: 0.5 mg/dL (ref 0.3–1.2)
Total Protein: 6.6 g/dL (ref 6.5–8.1)

## 2021-04-21 LAB — CBC
HCT: 44.3 % (ref 39.0–52.0)
Hemoglobin: 13.4 g/dL (ref 13.0–17.0)
MCH: 27.2 pg (ref 26.0–34.0)
MCHC: 30.2 g/dL (ref 30.0–36.0)
MCV: 90 fL (ref 80.0–100.0)
Platelets: 275 10*3/uL (ref 150–400)
RBC: 4.92 MIL/uL (ref 4.22–5.81)
RDW: 17.2 % — ABNORMAL HIGH (ref 11.5–15.5)
WBC: 17.9 10*3/uL — ABNORMAL HIGH (ref 4.0–10.5)
nRBC: 0 % (ref 0.0–0.2)

## 2021-04-21 LAB — LIPID PANEL
Cholesterol: 216 mg/dL — ABNORMAL HIGH (ref 0–200)
HDL: 46 mg/dL (ref >40)
LDL Cholesterol: 148 mg/dL — ABNORMAL HIGH (ref 0–99)
Total CHOL/HDL Ratio: 4.7 RATIO
Triglycerides: 110 mg/dL (ref <150)
VLDL: 22 mg/dL (ref 0–40)

## 2021-04-21 LAB — MAGNESIUM: Magnesium: 2.5 mg/dL — ABNORMAL HIGH (ref 1.7–2.4)

## 2021-04-21 MED ORDER — PREDNISONE 20 MG PO TABS: 40.0000 mg | ORAL_TABLET | Freq: Every day | ORAL | 0 refills | Status: AC

## 2021-04-21 MED ORDER — LOSARTAN POTASSIUM 100 MG PO TABS: 100.0000 mg | ORAL_TABLET | Freq: Every day | ORAL | 0 refills | Status: DC

## 2021-04-21 MED ORDER — CARVEDILOL 12.5 MG PO TABS: 12.5000 mg | ORAL_TABLET | Freq: Two times a day (BID) | ORAL | 0 refills | Status: DC

## 2021-04-21 MED ORDER — ATORVASTATIN CALCIUM 40 MG PO TABS: 40.0000 mg | ORAL_TABLET | Freq: Every day | ORAL | 0 refills | Status: DC

## 2021-04-21 MED ORDER — SPIRONOLACTONE 25 MG PO TABS: 25.0000 mg | ORAL_TABLET | Freq: Every day | ORAL | 0 refills | Status: DC

## 2021-04-21 MED ORDER — IPRATROPIUM BROMIDE HFA 17 MCG/ACT IN AERS: 1.0000 | INHALATION_SPRAY | Freq: Three times a day (TID) | RESPIRATORY_TRACT | 2 refills | Status: DC

## 2021-04-21 MED ORDER — NICOTINE 21 MG/24HR TD PT24: 21.0000 mg | MEDICATED_PATCH | Freq: Every day | TRANSDERMAL | 0 refills | Status: DC | PRN

## 2021-04-21 MED ORDER — DOXYCYCLINE HYCLATE 100 MG PO TABS: 100.0000 mg | ORAL_TABLET | Freq: Two times a day (BID) | ORAL | 0 refills | Status: AC

## 2021-04-21 MED ORDER — ASPIRIN 81 MG PO TBEC
81.0000 mg | DELAYED_RELEASE_TABLET | Freq: Every day | ORAL | 0 refills | Status: DC
  Filled 2021-08-08: qty 30, 30d supply, fill #0

## 2021-04-21 MED ORDER — CARVEDILOL 25 MG PO TABS: 25.0000 mg | ORAL_TABLET | Freq: Two times a day (BID) | ORAL | 0 refills | Status: DC

## 2021-04-21 MED ORDER — ALBUTEROL SULFATE HFA 108 (90 BASE) MCG/ACT IN AERS: 2.0000 | INHALATION_SPRAY | Freq: Four times a day (QID) | RESPIRATORY_TRACT | 2 refills | Status: DC | PRN

## 2021-04-21 NOTE — TOC Transition Note (Addendum)
Transition of Care [facility]) - CM/SW Discharge Note   Patient Details  Name: Nicholas Chung MRN: [id number] Date of Birth: 08-21-1965  Transition of [facility] CM/SW Contact:  Nicholas Price, RN Phone Number: 04/21/2021, 11:53 AM   Clinical Narrative:  2/19: Patient has discharge orders for today. Per rounds patient refused inpatient cardiac cath. To follow up with outpatient cardiology.  Patient has no PCP and no insurance. Htx of ETOH/smoking. Declined earlier SA information from weekday RN CM. Per attending provider DC Summary recommendations for follow up:  Recommendations for Outpatient Follow-up:  Follow up with PCP in 1-2 weeks Please obtain BMP/CBC in one week your next doctors visit.  Prednisone, albuterol and ipratropium inhaler prescribed initially he can use inhaler every 6 hours and as needed thereafter in 3-4 days he can use it as needed Would recommend outpatient cardiology follow-up, referral has been given.  He declined inpatient RHC/LHC.  He opted for medical management therefore prescribed aspirin, statin, Coreg, losartan and Aldactone.  This will eventually be need to be titrated.  Unable to prescribe Entresto as patient does not have insurance. Counseled to quit using cigarettes and alcohol.  Nicotine patch prescribed. (Per Dr.Ankit Chirag Amin).  Nicholas Davies RN CM       Final next level of care: [place] Barriers to Discharge: Barriers Resolved   Patient Goals and CMS Choice        Discharge Placement                       Discharge Plan and Services     Post Acute Care Choice: NA          DME Arranged: N/A DME Agency: NA       HH Arranged: NA HH Agency: NA        Social Determinants of Health (SDOH) Interventions     Readmission Risk Interventions No flowsheet data found.

## 2021-04-21 NOTE — Discharge Summary (Addendum)
Physician Discharge Summary  Nicholas Chung BTD:[id number] DOB: November 15, 1965 DOA: 04/19/2021  PCP: Pcp, No  Admit date: 04/19/2021 Discharge date: 04/21/2021  Admitted From: Home Disposition: Home  Recommendations for Outpatient Follow-up:  Follow up with PCP in 1-2 weeks Please obtain BMP/CBC in one week your next doctors visit.  Prednisone, albuterol and ipratropium inhaler prescribed initially he can use inhaler every 6 hours and as needed thereafter in 3-4 days he can use it as needed Would recommend outpatient cardiology follow-up, referral has been given.  He declined inpatient RHC/LHC.  He opted for medical management therefore prescribed aspirin, statin, Coreg, losartan and Aldactone.  This will eventually be need to be titrated.  Unable to prescribe Entresto as patient does not have insurance. Counseled to quit using cigarettes and alcohol.  Nicotine patch prescribed Outptn repeat CT chest in 3 months to reeval Pulm Nodules   Discharge Condition: Stable CODE STATUS: Full code Diet recommendation: Heart healthy, low-salt.  Brief/Interim Summary: 56 year old with history of HTN, tobacco use, medication noncompliance comes to the hospital with complaints of shortness of breath intermittently ongoing for 3 weeks.  This is slowly progressed during this time.  Upon admission he was found to be in COPD exacerbation with slightly elevated troponin.  He denied any chest pain.  He was started on COPD treatment-steroids, bronchodilators.  Cardiology was consulted, echocardiogram showed EF of 45% with grade 2 diastolic dysfunction, apical wall hypokinesia.  RHC/LHC was offered but patient declined.  Today patient is doing much better therefore we will transition him to oral steroids and bronchodilators.  We will prescribe an cardiac medications as recommended and will advise him to follow-up outpatient with cardiology. Recommendations as stated above.   Assessment and Plan: * Acute respiratory  failure ([place])- (present on admission) Likely secondary to COPD exacerbation.  Doing much better today.  COPD with acute exacerbation ([place])- (present on admission) Smokes about 1 pack of cigarettes per day for almost last 40 years. .  Nicotine patch as needed.  Counseled to quit smoking. Leukocytosis secondary to steroid use.  Will complete 5 days of oral doxycycline.  Albuterol and ipratropium inhaler has been prescribed, initially to use this inhaler every 6 hours for the next 2-3 days thereafter as needed.  Overall prednisone prescribed  Chronic combined systolic and diastolic CHF (congestive heart failure) (HCC) Echocardiogram had shown EF of 45% with grade 2 diastolic dysfunction.  Has apical wall hypokinesia.  Patient was offered RHC/LHC but he declined. Cardiology team following-started on aspirin, Lipitor, Coreg, ARB, Aldactone.  Prescriptions have been given for 30 days    Elevated troponin- (present on admission) From demand ischemia.  Cardiology team following.  Ongoing work-up.  A1c 5.6, LDL 148.  Started on statin.  Pulmonary nodules Given his history of tobacco use, will need repeat CT chest outpatient in about 3 months.  Nicotine dependence- (present on admission) Nicotine patch as needed  Alcohol dependence ([place])- (present on admission) Counseled to quit using this.  He is not showing any signs of withdrawal  Hypertension- (present on admission) Started on Coreg, losartan and Aldactone.  This will need to be adjusted as necessary going forward.  Low-salt diet recommended       Body mass index is 31.87 kg/m.         Discharge Diagnoses:  Principal Problem:   Acute respiratory failure ([place]) Active Problems:   COPD with acute exacerbation (HCC)   Elevated troponin   Chronic combined systolic and diastolic CHF (congestive heart failure) ([place])  Hypertension   Alcohol dependence ([place])   Nicotine dependence   Pulmonary  nodules      Consultations: Cardiology  Subjective: Today he feels much better and would like to go home  Discharge Exam: Vitals:   04/21/21 0758 04/21/21 1252  BP: (!) 140/108 (!) 152/109  Pulse: 83 78  Resp: 18   Temp: 97.9 F (36.6 C)   SpO2: 98% 98%   Vitals:   04/21/21 0545 04/21/21 0744 04/21/21 0758 04/21/21 1252  BP: (!) 141/109  (!) 140/108 (!) 152/109  Pulse: 85  83 78  Resp:   18   Temp:   97.9 F (36.6 C)   TempSrc:      SpO2: 98% 99% 98% 98%  Weight:      Height:        General: Pt is alert, awake, not in acute distress Cardiovascular: RRR, S1/S2 +, no rubs, no gallops Respiratory: CTA bilaterally, no wheezing, no rhonchi Abdominal: Soft, NT, ND, bowel sounds + Extremities: no edema, no cyanosis  Discharge Instructions  Discharge Instructions     Ambulatory referral to Cardiology   Complete by: As directed       Allergies as of 04/21/2021   No Known Allergies      Medication List     TAKE these medications    albuterol 108 (90 Base) MCG/ACT inhaler Commonly known as: VENTOLIN HFA Inhale 2 puffs into the lungs every 6 (six) hours as needed for wheezing or shortness of breath.   aspirin 81 MG EC tablet Take 1 tablet (81 mg total) by mouth daily. Swallow whole.   atorvastatin 40 MG tablet Commonly known as: LIPITOR Take 1 tablet (40 mg total) by mouth daily.   carvedilol 25 MG tablet Commonly known as: Coreg Take 1 tablet (25 mg total) by mouth 2 (two) times daily.   doxycycline 100 MG tablet Commonly known as: VIBRA-TABS Take 1 tablet (100 mg total) by mouth every 12 (twelve) hours for 3 days.   ipratropium 17 MCG/ACT inhaler Commonly known as: ATROVENT HFA Inhale 1 puff into the lungs 3 (three) times daily.   losartan 100 MG tablet Commonly known as: COZAAR Take 1 tablet (100 mg total) by mouth daily.   nicotine 21 mg/24hr patch Commonly known as: NICODERM CQ - dosed in mg/24 hours Place 1 patch (21 mg total) onto the  skin daily as needed (nicotine withdrawal).   predniSONE 20 MG tablet Commonly known as: DELTASONE Take 2 tablets (40 mg total) by mouth daily with breakfast for 4 days.   spironolactone 25 MG tablet Commonly known as: ALDACTONE Take 1 tablet (25 mg total) by mouth daily.        Follow-up Information     [facility]. Schedule an appointment as soon as possible for a visit in 1 week(s).   Specialty: Cardiology Contact information: [place] [phone] [place] [phone]               No Known Allergies  You were cared for by a hospitalist during your hospital stay. If you have any questions about your discharge medications or the care you received while you were in the hospital after you are discharged, you can call the unit and asked to speak with the hospitalist on call if the hospitalist that took care of you is not available. Once you are discharged, your primary care physician will handle any further medical issues. Please note that no refills for  any discharge medications will be authorized once you are discharged, as it is imperative that you return to your primary care physician (or establish a relationship with a primary care physician if you do not have one) for your aftercare needs so that they can reassess your need for medications and monitor your lab values.   Procedures/Studies: DG Chest 2 View  Result Date: 04/19/2021 CLINICAL DATA:  Shortness of breath and chest pain EXAM: CHEST - 2 VIEW COMPARISON:  None. FINDINGS: The heart size and mediastinal contours are within normal limits. Both lungs are clear. The visualized skeletal structures are unremarkable. IMPRESSION: No active cardiopulmonary disease. Electronically Signed   By: Ulyses Jarred M.D.   On: 04/19/2021 03:12   CT Angio Chest PE W and/or Wo Contrast  Result Date: 04/19/2021 CLINICAL DATA:  Chest pain, shortness of breath.  EXAM: CT ANGIOGRAPHY CHEST WITH CONTRAST TECHNIQUE: Multidetector CT imaging of the chest was performed using the standard protocol during bolus administration of intravenous contrast. Multiplanar CT image reconstructions and MIPs were obtained to evaluate the vascular anatomy. RADIATION DOSE REDUCTION: This exam was performed according to the departmental dose-optimization program which includes automated exposure control, adjustment of the mA and/or kV according to patient size and/or use of iterative reconstruction technique. CONTRAST:  151mL OMNIPAQUE IOHEXOL 350 MG/ML SOLN COMPARISON:  Radiograph of same day. FINDINGS: Cardiovascular: There is no evidence of large central pulmonary embolus. However, due to persistent respiratory motion artifact, smaller and more peripheral pulmonary emboli in the lower lobe branches bilaterally cannot be excluded on the basis of this exam. Normal cardiac size. No pericardial effusion. Mediastinum/Nodes: No enlarged mediastinal, hilar, or axillary lymph nodes. Thyroid gland, trachea, and esophagus demonstrate no significant findings. Lungs/Pleura: No pneumothorax or pleural effusion is noted. 13 x 10 mm nodular density is noted laterally in the right lower lobe best seen on image number 67 of series 6. Left lung is unremarkable. Upper Abdomen: No acute abnormality. Musculoskeletal: No chest wall abnormality. No acute or significant osseous findings. Review of the MIP images confirms the above findings. IMPRESSION: There is no evidence of large central pulmonary embolus. However, due to persistent respiratory motion artifact, smaller and more peripheral pulmonary emboli in the lower lobe branches bilaterally cannot be excluded on the basis of this exam. 13 x 10 mm nodular density noted laterally in the right lower lobe. Consider one of the following in 3 months for both low-risk and high-risk individuals: (a) repeat chest CT, (b) follow-up PET-CT, or (c) tissue sampling. This  recommendation follows the consensus statement: Guidelines for Management of Incidental Pulmonary Nodules Detected on CT Images: From the Fleischner Society 2017; Radiology 2017; 284:228-243. Electronically Signed   By: Marijo Conception M.D.   On: 04/19/2021 05:30   ECHOCARDIOGRAM COMPLETE  Result Date: 04/19/2021    ECHOCARDIOGRAM REPORT   Patient Name:   Nicholas Chung Date of Exam: 04/19/2021 Medical Rec #:  [id number]   Height:       72.0 in Accession #:    [id number]  Weight:       235.0 lb Date of Birth:  December 13, 1965  BSA:          2.282 m Patient Age:    56 years    BP:           171/105 mmHg Patient Gender: M           HR:           96 bpm. Exam Location:  [facility]  Procedure: 2D Echo, Color Doppler, Cardiac Doppler and Strain Analysis Indications:     Elevated troponin  History:         Patient has no prior history of Echocardiogram examinations.                  Risk Factors:Hypertension and Current Smoker.  Sonographer:     Charmayne Sheer Referring Phys:  [id number] EUMPNTIR AGBATA Diagnosing Phys: Ida Rogue MD  Sonographer Comments: Global longitudinal strain was attempted. IMPRESSIONS  1. Left ventricular ejection fraction, by estimation, is 40 to 45%. The left ventricle has mildly decreased function. The left ventricle demonstrates mild global hypokinesis with severe apical hypokinesis. Left ventricular diastolic parameters are consistent with Grade II diastolic dysfunction (pseudonormalization). The average left ventricular global longitudinal strain is -13.2 %. The global longitudinal strain is abnormal.  2. Right ventricular systolic function is normal. The right ventricular size is normal. Tricuspid regurgitation signal is inadequate for assessing PA pressure.  3. Left atrial size was mildly dilated.  4. The mitral valve is normal in structure. Moderate mitral valve regurgitation. No evidence of mitral stenosis.  5. The aortic valve was not well visualized. Aortic valve regurgitation is not visualized. No  aortic stenosis is present.  6. The inferior vena cava is normal in size with greater than 50% respiratory variability, suggesting right atrial pressure of 3 mmHg. FINDINGS  Left Ventricle: Left ventricular ejection fraction, by estimation, is 40 to 45%. The left ventricle has mildly decreased function. The left ventricle demonstrates global hypokinesis. The average left ventricular global longitudinal strain is -13.2 %. The global longitudinal strain is abnormal. The left ventricular internal cavity size was normal in size. There is no left ventricular hypertrophy. Left ventricular diastolic parameters are consistent with Grade II diastolic dysfunction (pseudonormalization). Right Ventricle: The right ventricular size is normal. No increase in right ventricular wall thickness. Right ventricular systolic function is normal. Tricuspid regurgitation signal is inadequate for assessing PA pressure. Left Atrium: Left atrial size was mildly dilated. Right Atrium: Right atrial size was normal in size. Pericardium: There is no evidence of pericardial effusion. Mitral Valve: The mitral valve is normal in structure. There is mild thickening of the mitral valve leaflet(s). Moderate mitral valve regurgitation. No evidence of mitral valve stenosis. MV peak gradient, 8.6 mmHg. The mean mitral valve gradient is 4.0 mmHg. Tricuspid Valve: The tricuspid valve is normal in structure. Tricuspid valve regurgitation is mild . No evidence of tricuspid stenosis. Aortic Valve: The aortic valve was not well visualized. Aortic valve regurgitation is not visualized. No aortic stenosis is present. Aortic valve mean gradient measures 9.0 mmHg. Aortic valve peak gradient measures 18.0 mmHg. Aortic valve area, by VTI measures 1.85 cm. Pulmonic Valve: The pulmonic valve was normal in structure. Pulmonic valve regurgitation is not visualized. No evidence of pulmonic stenosis. Aorta: The aortic root is normal in size and structure. Venous: The  inferior vena cava is normal in size with greater than 50% respiratory variability, suggesting right atrial pressure of 3 mmHg. IAS/Shunts: No atrial level shunt detected by color flow Doppler.  LEFT VENTRICLE PLAX 2D LVIDd:         5.98 cm      Diastology LVIDs:         4.58 cm      LV e' medial:    7.51 cm/s LV PW:         0.97 cm      LV E/e' medial:  15.3 LV IVS:  1.14 cm      LV e' lateral:   4.90 cm/s LVOT diam:     1.90 cm      LV E/e' lateral: 23.5 LV SV:         60 LV SV Index:   26           2D Longitudinal Strain LVOT Area:     2.84 cm     2D Strain GLS Avg:     -13.2 %  LV Volumes (MOD) LV vol d, MOD A2C: 219.0 ml LV vol d, MOD A4C: 220.0 ml LV vol s, MOD A2C: 134.0 ml LV vol s, MOD A4C: 119.0 ml LV SV MOD A2C:     85.0 ml LV SV MOD A4C:     220.0 ml LV SV MOD BP:      92.2 ml RIGHT VENTRICLE RV Basal diam:  2.85 cm LEFT ATRIUM             Index        RIGHT ATRIUM           Index LA diam:        4.70 cm 2.06 cm/m   RA Area:     13.10 cm LA Vol (A2C):   50.5 ml 22.13 ml/m  RA Volume:   24.80 ml  10.87 ml/m LA Vol (A4C):   61.2 ml 26.82 ml/m LA Biplane Vol: 58.0 ml 25.42 ml/m  AORTIC VALVE                     PULMONIC VALVE AV Area (Vmax):    1.69 cm      PV Vmax:       1.07 m/s AV Area (Vmean):   1.82 cm      PV Vmean:      72.000 cm/s AV Area (VTI):     1.85 cm      PV VTI:        0.212 m AV Vmax:           212.00 cm/s   PV Peak grad:  4.6 mmHg AV Vmean:          138.000 cm/s  PV Mean grad:  2.0 mmHg AV VTI:            0.321 m AV Peak Grad:      18.0 mmHg AV Mean Grad:      9.0 mmHg LVOT Vmax:         126.00 cm/s LVOT Vmean:        88.600 cm/s LVOT VTI:          0.210 m LVOT/AV VTI ratio: 0.65  AORTA Ao Root diam: 3.30 cm MITRAL VALVE MV Area (PHT): 5.46 cm     SHUNTS MV Area VTI:   2.36 cm     Systemic VTI:  0.21 m MV Peak grad:  8.6 mmHg     Systemic Diam: 1.90 cm MV Mean grad:  4.0 mmHg MV Vmax:       1.47 m/s MV Vmean:      93.0 cm/s MV Decel Time: 139 msec MV E velocity: 115.00  cm/s MV A velocity: 83.70 cm/s MV E/A ratio:  1.37 Ida Rogue MD Electronically signed by Ida Rogue MD Signature Date/Time: 04/19/2021/6:21:21 PM    Final      The results of significant diagnostics from this hospitalization (including imaging, microbiology, ancillary and laboratory) are listed below for reference.     Microbiology: Recent  Results (from the past 240 hour(s))  Resp Panel by RT-PCR (Flu A&B, Covid) Nasopharyngeal Swab     Status: None   Collection Time: 04/19/21  2:54 AM   Specimen: Nasopharyngeal Swab; Nasopharyngeal(NP) swabs in vial transport medium  Result Value Ref Range Status   SARS Coronavirus 2 by RT PCR NEGATIVE NEGATIVE Final    Comment: (NOTE) SARS-CoV-2 target nucleic acids are NOT DETECTED.  The SARS-CoV-2 RNA is generally detectable in upper respiratory specimens during the acute phase of infection. The lowest concentration of SARS-CoV-2 viral copies this assay can detect is 138 copies/mL. A negative result does not preclude SARS-Cov-2 infection and should not be used as the sole basis for treatment or other patient management decisions. A negative result may occur with  improper specimen collection/handling, submission of specimen other than nasopharyngeal swab, presence of viral mutation(s) within the areas targeted by this assay, and inadequate number of viral copies(<138 copies/mL). A negative result must be combined with clinical observations, patient history, and epidemiological information. The expected result is Negative.  Fact Sheet for Patients:  EntrepreneurPulse.com.au  Fact Sheet for Healthcare Providers:  IncredibleEmployment.be  This test is no t yet approved or cleared by the [place] FDA and  has been authorized for detection and/or diagnosis of SARS-CoV-2 by FDA under an Emergency Use Authorization (EUA). This EUA will remain  in effect (meaning this test can be used) for the duration  of the COVID-19 declaration under Section 564(b)(1) of the Act, 21 U.S.C.section 360bbb-3(b)(1), unless the authorization is terminated  or revoked sooner.       Influenza A by PCR NEGATIVE NEGATIVE Final   Influenza B by PCR NEGATIVE NEGATIVE Final    Comment: (NOTE) The Xpert Xpress SARS-CoV-2/FLU/RSV plus assay is intended as an aid in the diagnosis of influenza from Nasopharyngeal swab specimens and should not be used as a sole basis for treatment. Nasal washings and aspirates are unacceptable for Xpert Xpress SARS-CoV-2/FLU/RSV testing.  Fact Sheet for Patients: EntrepreneurPulse.com.au  Fact Sheet for Healthcare Providers: IncredibleEmployment.be  This test is not yet approved or cleared by the [place] FDA and has been authorized for detection and/or diagnosis of SARS-CoV-2 by FDA under an Emergency Use Authorization (EUA). This EUA will remain in effect (meaning this test can be used) for the duration of the COVID-19 declaration under Section 564(b)(1) of the Act, 21 U.S.C. section 360bbb-3(b)(1), unless the authorization is terminated or revoked.  Performed at [facility], [address] [place], [place] [address]      Labs: BNP (last 3 results) Recent Labs    04/19/21 0250  BNP 387.5*   Basic Metabolic Panel: Recent Labs  Lab 04/19/21 0250 04/20/21 0257 04/21/21 0626  NA 144 137 137  K 3.5 3.7 4.6  CL 110 107 104  CO2 26 25 25   GLUCOSE 124* 188* 155*  BUN 20 23* 27*  CREATININE 1.14 0.97 0.92  CALCIUM 8.7* 8.7* 8.3*  MG  --   --  2.5*   Liver Function Tests: Recent Labs  Lab 04/21/21 0626  AST 22  ALT 25  ALKPHOS 75  BILITOT 0.5  PROT 6.6  ALBUMIN 3.2*   No results for input(s): LIPASE, AMYLASE in the last 168 hours. No results for input(s): AMMONIA in the last 168 hours. CBC: Recent Labs  Lab 04/19/21 0250 04/20/21 0257 04/21/21 0626  WBC 14.8* 19.5* 17.9*  HGB 13.9 13.2 13.4   HCT 44.2 42.3 44.3  MCV 87.2 87.6 90.0  PLT  290 283 275   Cardiac Enzymes: No results for input(s): CKTOTAL, CKMB, CKMBINDEX, TROPONINI in the last 168 hours. BNP: Invalid input(s): POCBNP CBG: No results for input(s): GLUCAP in the last 168 hours. D-Dimer No results for input(s): DDIMER in the last 72 hours. Hgb A1c Recent Labs    04/20/21 0257  HGBA1C 5.6   Lipid Profile Recent Labs    04/21/21 0626  CHOL 216*  HDL 46  LDLCALC 148*  TRIG 110  CHOLHDL 4.7   Thyroid function studies No results for input(s): TSH, T4TOTAL, T3FREE, THYROIDAB in the last 72 hours.  Invalid input(s): FREET3 Anemia work up No results for input(s): VITAMINB12, FOLATE, FERRITIN, TIBC, IRON, RETICCTPCT in the last 72 hours. Urinalysis No results found for: COLORURINE, APPEARANCEUR, [place], [place], GLUCOSEU, [place], [place], [place], PROTEINUR, UROBILINOGEN, NITRITE, LEUKOCYTESUR Sepsis Labs Invalid input(s): PROCALCITONIN,  WBC,  LACTICIDVEN Microbiology Recent Results (from the past 240 hour(s))  Resp Panel by RT-PCR (Flu A&B, Covid) Nasopharyngeal Swab     Status: None   Collection Time: 04/19/21  2:54 AM   Specimen: Nasopharyngeal Swab; Nasopharyngeal(NP) swabs in vial transport medium  Result Value Ref Range Status   SARS Coronavirus 2 by RT PCR NEGATIVE NEGATIVE Final    Comment: (NOTE) SARS-CoV-2 target nucleic acids are NOT DETECTED.  The SARS-CoV-2 RNA is generally detectable in upper respiratory specimens during the acute phase of infection. The lowest concentration of SARS-CoV-2 viral copies this assay can detect is 138 copies/mL. A negative result does not preclude SARS-Cov-2 infection and should not be used as the sole basis for treatment or other patient management decisions. A negative result may occur with  improper specimen collection/handling, submission of specimen other than nasopharyngeal swab, presence of viral mutation(s) within the areas targeted by this  assay, and inadequate number of viral copies(<138 copies/mL). A negative result must be combined with clinical observations, patient history, and epidemiological information. The expected result is Negative.  Fact Sheet for Patients:  EntrepreneurPulse.com.au  Fact Sheet for Healthcare Providers:  IncredibleEmployment.be  This test is no t yet approved or cleared by the [place] FDA and  has been authorized for detection and/or diagnosis of SARS-CoV-2 by FDA under an Emergency Use Authorization (EUA). This EUA will remain  in effect (meaning this test can be used) for the duration of the COVID-19 declaration under Section 564(b)(1) of the Act, 21 U.S.C.section 360bbb-3(b)(1), unless the authorization is terminated  or revoked sooner.       Influenza A by PCR NEGATIVE NEGATIVE Final   Influenza B by PCR NEGATIVE NEGATIVE Final    Comment: (NOTE) The Xpert Xpress SARS-CoV-2/FLU/RSV plus assay is intended as an aid in the diagnosis of influenza from Nasopharyngeal swab specimens and should not be used as a sole basis for treatment. Nasal washings and aspirates are unacceptable for Xpert Xpress SARS-CoV-2/FLU/RSV testing.  Fact Sheet for Patients: EntrepreneurPulse.com.au  Fact Sheet for Healthcare Providers: IncredibleEmployment.be  This test is not yet approved or cleared by the [place] FDA and has been authorized for detection and/or diagnosis of SARS-CoV-2 by FDA under an Emergency Use Authorization (EUA). This EUA will remain in effect (meaning this test can be used) for the duration of the COVID-19 declaration under Section 564(b)(1) of the Act, 21 U.S.C. section 360bbb-3(b)(1), unless the authorization is terminated or revoked.  Performed at [facility], [address] [place], [place] [address]      Time coordinating discharge:  I have spent 35 minutes face to face with  the patient and on the ward discussing the patients care, assessment, plan and disposition with other care givers. >50% of the time was devoted counseling the patient about the risks and benefits of treatment/Discharge disposition and coordinating care.   SIGNED:   Damita Lack, MD  [facility] 04/21/2021, 12:59 PM   If 7PM-7AM, please contact night-coverage

## 2021-04-21 NOTE — Plan of Care (Signed)
IV removed, BP taken prior to discharge, discharge instructions reviewed, RX given and patient discharged to home with wife

## 2021-04-21 NOTE — Progress Notes (Signed)
Progress Note  Patient Name: Nicholas Chung Date of Encounter: 04/21/2021  [facility] Cardiologist: Kathlyn Sacramento, MD   Subjective   Feeling well today, no chest pain concerning for angina Feels his breathing is improving On steroids Blood pressure slowly improving  Inpatient Medications    Scheduled Meds:  aspirin EC  81 mg Oral Daily   atorvastatin  40 mg Oral Daily   budesonide  0.5 mg Nebulization BID   carvedilol  12.5 mg Oral BID WC   dextromethorphan-guaiFENesin  1 tablet Oral BID   doxycycline  100 mg Oral Q12H   enoxaparin (LOVENOX) injection  0.5 mg/kg Subcutaneous Y24M   folic acid  1 mg Oral Daily   LORazepam  0-4 mg Intravenous Q12H   losartan  100 mg Oral Daily   methylPREDNISolone (SOLU-MEDROL) injection  40 mg Intravenous Q12H   multivitamin with minerals  1 tablet Oral Daily   sodium chloride flush  3 mL Intravenous Q12H   spironolactone  25 mg Oral Daily   thiamine  100 mg Oral Daily   Or   thiamine  100 mg Intravenous Daily   Continuous Infusions:  sodium chloride     PRN Meds: sodium chloride, acetaminophen **OR** acetaminophen, alum & mag hydroxide-simeth, guaiFENesin, ipratropium-albuterol, LORazepam **OR** LORazepam, nicotine, nitroGLYCERIN, ondansetron **OR** ondansetron (ZOFRAN) IV, senna-docusate, sodium chloride flush, traZODone   Vital Signs    Vitals:   04/21/21 0545 04/21/21 0744 04/21/21 0758 04/21/21 1252  BP: (!) 141/109  (!) 140/108 (!) 152/109  Pulse: 85  83 78  Resp:   18   Temp:   97.9 F (36.6 C)   TempSrc:      SpO2: 98% 99% 98% 98%  Weight:      Height:        Intake/Output Summary (Last 24 hours) at 04/21/2021 1403 Last data filed at 04/21/2021 0539 Gross per 24 hour  Intake --  Output 0 ml  Net 0 ml   Last 3 Weights 04/19/2021  Weight (lbs) 235 lb  Weight (kg) 106.595 kg      Telemetry    Normal sinus rhythm- Personally Reviewed  ECG     - Personally Reviewed  Physical Exam   GEN: No acute  distress.   Neck: No JVD Cardiac: RRR, no murmurs, rubs, or gallops.  Respiratory: Clear to auscultation bilaterally. GI: Soft, nontender, non-distended  MS: No edema; No deformity. Neuro:  Nonfocal  Psych: Normal affect   Labs    High Sensitivity Troponin:   Recent Labs  Lab 04/19/21 0250 04/19/21 0451 04/19/21 0911 04/19/21 1500  TROPONINIHS 61* 57* 52* 42*     Chemistry Recent Labs  Lab 04/19/21 0250 04/20/21 0257 04/21/21 0626  NA 144 137 137  K 3.5 3.7 4.6  CL 110 107 104  CO2 26 25 25   GLUCOSE 124* 188* 155*  BUN 20 23* 27*  CREATININE 1.14 0.97 0.92  CALCIUM 8.7* 8.7* 8.3*  MG  --   --  2.5*  PROT  --   --  6.6  ALBUMIN  --   --  3.2*  AST  --   --  22  ALT  --   --  25  ALKPHOS  --   --  75  BILITOT  --   --  0.5  GFRNONAA >60 >60 >60  ANIONGAP 8 5 8     Lipids  Recent Labs  Lab 04/21/21 0626  CHOL 216*  TRIG 110  HDL 46  LDLCALC 148*  CHOLHDL 4.7    Hematology Recent Labs  Lab 04/19/21 0250 04/20/21 0257 04/21/21 0626  WBC 14.8* 19.5* 17.9*  RBC 5.07 4.83 4.92  HGB 13.9 13.2 13.4  HCT 44.2 42.3 44.3  MCV 87.2 87.6 90.0  MCH 27.4 27.3 27.2  MCHC 31.4 31.2 30.2  RDW 16.9* 16.3* 17.2*  PLT 290 283 275   Thyroid No results for input(s): TSH, FREET4 in the last 168 hours.  BNP Recent Labs  Lab 04/19/21 0250  BNP 233.5*    DDimer No results for input(s): DDIMER in the last 168 hours.   Radiology    ECHOCARDIOGRAM COMPLETE  Result Date: 04/19/2021    ECHOCARDIOGRAM REPORT   Patient Name:   Nicholas Chung Date of Exam: 04/19/2021 Medical Rec #:  [id number]   Height:       72.0 in Accession #:    [id number]  Weight:       235.0 lb Date of Birth:  09/23/1965  BSA:          2.282 m Patient Age:    56 years    BP:           171/105 mmHg Patient Gender: M           HR:           96 bpm. Exam Location:  [facility] Procedure: 2D Echo, Color Doppler, Cardiac Doppler and Strain Analysis Indications:     Elevated troponin  History:         Patient has no  prior history of Echocardiogram examinations.                  Risk Factors:Hypertension and Current Smoker.  Sonographer:     Charmayne Sheer Referring Phys:  [id number] AGBATA Diagnosing Phys: Ida Rogue MD  Sonographer Comments: Global longitudinal strain was attempted. IMPRESSIONS  1. Left ventricular ejection fraction, by estimation, is 40 to 45%. The left ventricle has mildly decreased function. The left ventricle demonstrates mild global hypokinesis with severe apical hypokinesis. Left ventricular diastolic parameters are consistent with Grade II diastolic dysfunction (pseudonormalization). The average left ventricular global longitudinal strain is -13.2 %. The global longitudinal strain is abnormal.  2. Right ventricular systolic function is normal. The right ventricular size is normal. Tricuspid regurgitation signal is inadequate for assessing PA pressure.  3. Left atrial size was mildly dilated.  4. The mitral valve is normal in structure. Moderate mitral valve regurgitation. No evidence of mitral stenosis.  5. The aortic valve was not well visualized. Aortic valve regurgitation is not visualized. No aortic stenosis is present.  6. The inferior vena cava is normal in size with greater than 50% respiratory variability, suggesting right atrial pressure of 3 mmHg. FINDINGS  Left Ventricle: Left ventricular ejection fraction, by estimation, is 40 to 45%. The left ventricle has mildly decreased function. The left ventricle demonstrates global hypokinesis. The average left ventricular global longitudinal strain is -13.2 %. The global longitudinal strain is abnormal. The left ventricular internal cavity size was normal in size. There is no left ventricular hypertrophy. Left ventricular diastolic parameters are consistent with Grade II diastolic dysfunction (pseudonormalization). Right Ventricle: The right ventricular size is normal. No increase in right ventricular wall thickness. Right ventricular systolic  function is normal. Tricuspid regurgitation signal is inadequate for assessing PA pressure. Left Atrium: Left atrial size was mildly dilated. Right Atrium: Right atrial size was normal in size. Pericardium: There is no evidence of pericardial effusion. Mitral Valve: The mitral valve  is normal in structure. There is mild thickening of the mitral valve leaflet(s). Moderate mitral valve regurgitation. No evidence of mitral valve stenosis. MV peak gradient, 8.6 mmHg. The mean mitral valve gradient is 4.0 mmHg. Tricuspid Valve: The tricuspid valve is normal in structure. Tricuspid valve regurgitation is mild . No evidence of tricuspid stenosis. Aortic Valve: The aortic valve was not well visualized. Aortic valve regurgitation is not visualized. No aortic stenosis is present. Aortic valve mean gradient measures 9.0 mmHg. Aortic valve peak gradient measures 18.0 mmHg. Aortic valve area, by VTI measures 1.85 cm. Pulmonic Valve: The pulmonic valve was normal in structure. Pulmonic valve regurgitation is not visualized. No evidence of pulmonic stenosis. Aorta: The aortic root is normal in size and structure. Venous: The inferior vena cava is normal in size with greater than 50% respiratory variability, suggesting right atrial pressure of 3 mmHg. IAS/Shunts: No atrial level shunt detected by color flow Doppler.  LEFT VENTRICLE PLAX 2D LVIDd:         5.98 cm      Diastology LVIDs:         4.58 cm      LV e' medial:    7.51 cm/s LV PW:         0.97 cm      LV E/e' medial:  15.3 LV IVS:        1.14 cm      LV e' lateral:   4.90 cm/s LVOT diam:     1.90 cm      LV E/e' lateral: 23.5 LV SV:         60 LV SV Index:   26           2D Longitudinal Strain LVOT Area:     2.84 cm     2D Strain GLS Avg:     -13.2 %  LV Volumes (MOD) LV vol d, MOD A2C: 219.0 ml LV vol d, MOD A4C: 220.0 ml LV vol s, MOD A2C: 134.0 ml LV vol s, MOD A4C: 119.0 ml LV SV MOD A2C:     85.0 ml LV SV MOD A4C:     220.0 ml LV SV MOD BP:      92.2 ml RIGHT  VENTRICLE RV Basal diam:  2.85 cm LEFT ATRIUM             Index        RIGHT ATRIUM           Index LA diam:        4.70 cm 2.06 cm/m   RA Area:     13.10 cm LA Vol (A2C):   50.5 ml 22.13 ml/m  RA Volume:   24.80 ml  10.87 ml/m LA Vol (A4C):   61.2 ml 26.82 ml/m LA Biplane Vol: 58.0 ml 25.42 ml/m  AORTIC VALVE                     PULMONIC VALVE AV Area (Vmax):    1.69 cm      PV Vmax:       1.07 m/s AV Area (Vmean):   1.82 cm      PV Vmean:      72.000 cm/s AV Area (VTI):     1.85 cm      PV VTI:        0.212 m AV Vmax:           212.00 cm/s   PV Peak grad:  4.6  mmHg AV Vmean:          138.000 cm/s  PV Mean grad:  2.0 mmHg AV VTI:            0.321 m AV Peak Grad:      18.0 mmHg AV Mean Grad:      9.0 mmHg LVOT Vmax:         126.00 cm/s LVOT Vmean:        88.600 cm/s LVOT VTI:          0.210 m LVOT/AV VTI ratio: 0.65  AORTA Ao Root diam: 3.30 cm MITRAL VALVE MV Area (PHT): 5.46 cm     SHUNTS MV Area VTI:   2.36 cm     Systemic VTI:  0.21 m MV Peak grad:  8.6 mmHg     Systemic Diam: 1.90 cm MV Mean grad:  4.0 mmHg MV Vmax:       1.47 m/s MV Vmean:      93.0 cm/s MV Decel Time: 139 msec MV E velocity: 115.00 cm/s MV A velocity: 83.70 cm/s MV E/A ratio:  1.37 Ida Rogue MD Electronically signed by Ida Rogue MD Signature Date/Time: 04/19/2021/6:21:21 PM    Final     Cardiac Studies     Patient Profile     57 y.o. male w/ a h/o HTN, tob abuse, etoh abuse, and bladder cancer, who was admitted 2/17 w/ progressive exertional dyspnea, intermittent chest tightness, and cough, and found have LV dysfunction (EF 40-45%).  Assessment & Plan   Demand ischemia In the setting of poorly controlled hypertension, Hypertensive heart disease with cardiomyopathy, CHF, mitral valve regurgitation Unable to exclude ischemia -Outpatient ischemic work-up planned either with cardiac catheterization versus cardiac CTA, he prefers to wait until he gets Medicaid coverage -Smoking cessation of smoking cessation  recommended -Denies anginal symptoms.  For any change in his status recommended he call our office immediately   Cardiomyopathy Likely multifactorial including poorly controlled hypertension, alcohol, unable to exclude ischemia Mild global hypokinesis with severe hypokinesis/akinesis of the apical region Given thinned out apical region, suspect this may be old not new infarct Aspirin, statin, beta-blocker   Hypertensive heart disease Recommend carvedilol 25 twice daily, losartan 100 May need Coreg 25 twice daily if numbers do not improve Recommend he call us with blood pressure measurements   Acute respiratory failure In the setting of pulmonary edema, COPD, smoker, hypertensive heart disease Steroids, nebs per internal medicine Smoking cessation recommended Stressed the need of a aggressive blood pressure control to prevent recurrence of pulmonary edema   Alcohol abuse Complete cessation recommended " I have done it before" Discussed effect of alcohol on his cardiac function   Smoking We have encouraged him to continue to work on weaning his cigarettes and smoking cessation. He will continue to work on this and does not want any assistance with chantix.     Greater than 50% was spent in counseling and coordination of care with patient Total encounter time 50 minutes or more   For questions or updates, please contact [facility] Please consult [email or web address] for contact info under        Signed, Ida Rogue, MD  04/21/2021, 2:03 PM

## 2021-04-30 ENCOUNTER — Telehealth: Payer: Self-pay | Admitting: Family

## 2021-04-30 ENCOUNTER — Ambulatory Visit: Payer: Medicaid Other | Admitting: Family

## 2021-04-30 NOTE — Telephone Encounter (Signed)
Patient did not show for his Heart Failure Clinic appointment on 04/30/21. Will attempt to reschedule.

## 2021-05-13 NOTE — Progress Notes (Signed)
Name: Nicholas Chung   DOB:  [birth year]   MRN:  [id number]  Nicholas Chung is a 56 yo male patient with a PMHx of HTN, tobacco abuse, COPD, combined HF, and pulmonary nodules.  Echo report from 04/19/21 reviewed and showed an EF of 40 to 45%. The left ventricle has mildly decreased function. The left ventricle demonstrates global hypokinesis.  Admitted 2/17-2/19/23 for worsening SOB and COPD exacerbation. Troponins were elevated and cardiology was consulted, recommending a R/L HC, but he ultimately declined and was discharged home.  He presents today for an initial visit to the heart failure clinic with a c/c of moderate fatigue with moderate exertion. He also endorses snoring, SOB with exertion. He denies CP, palpitation, dizziness, cough, syncope, pedal edema, weight gain or orthopnea.  Since his discharge, he has been weighing daily, checking his BP and taking all medications as prescribed at hospital discharge.   Past Medical History:  Diagnosis Date   Bladder cancer ([place])    Hypertension    Past Surgical History:  Procedure Laterality Date   REPLACEMENT TOTAL KNEE Left    No family history on file. Social History   Tobacco Use   Smoking status: Every Day    Packs/day: 1.00    Types: Cigarettes   Smokeless tobacco: Never  Substance Use Topics   Alcohol use: Yes    Comment: 2 pints/week   No Known Allergies Prior to Admission medications   Medication Sig Start Date End Date Taking? Authorizing Provider  albuterol (VENTOLIN HFA) 108 (90 Base) MCG/ACT inhaler Inhale 2 puffs into the lungs every 6 (six) hours as needed for wheezing or shortness of breath. 04/21/21   Amin, Jeanella Flattery, MD  aspirin EC 81 MG EC tablet Take 1 tablet (81 mg total) by mouth daily. Swallow whole. 04/21/21   Amin, Jeanella Flattery, MD  atorvastatin (LIPITOR) 40 MG tablet Take 1 tablet (40 mg total) by mouth daily. 04/21/21   Amin, Jeanella Flattery, MD  carvedilol (COREG) 25 MG tablet Take 1 tablet (25 mg total) by mouth 2  (two) times daily. 04/21/21 05/21/21  Amin, Jeanella Flattery, MD  ipratropium (ATROVENT HFA) 17 MCG/ACT inhaler Inhale 1 puff into the lungs 3 (three) times daily. 04/21/21 04/21/22  Amin, Jeanella Flattery, MD  losartan (COZAAR) 100 MG tablet Take 1 tablet (100 mg total) by mouth daily. 04/21/21   Amin, Jeanella Flattery, MD  spironolactone (ALDACTONE) 25 MG tablet Take 1 tablet (25 mg total) by mouth daily. 04/21/21   Damita Lack, MD    Review of Systems  Constitutional:  Positive for malaise/fatigue.  HENT: Negative.    Eyes:  Negative for blurred vision and double vision.  Respiratory:  Positive for shortness of breath. Negative for cough and wheezing.   Cardiovascular:  Negative for chest pain, palpitations, orthopnea and leg swelling.  Gastrointestinal: Negative.   Genitourinary: Negative.   Musculoskeletal: Negative.   Skin: Negative.   Neurological:  Negative for dizziness and headaches.  Endo/Heme/Allergies: Negative.   Psychiatric/Behavioral: Negative.      Vitals:   05/14/21 1121  BP: (!) 138/94  Pulse: 64  Resp: 16  SpO2: 96%  Weight: 239 lb 6 oz (108.6 kg)  Height: 6' (1.829 m)   Wt Readings from Last 3 Encounters:  05/14/21 239 lb 6 oz (108.6 kg)  04/19/21 235 lb (106.6 kg)   Lab Results  Component Value Date   CREATININE 1.08 05/14/2021   CREATININE 0.92 04/21/2021   CREATININE 0.97 04/20/2021   Physical Exam  Vitals and nursing note reviewed. Exam conducted with a chaperone present (SO).  Constitutional:      General: He is not in acute distress.    Appearance: He is not toxic-appearing.  Neck:     Vascular: No carotid bruit.  Cardiovascular:     Rate and Rhythm: Normal rate and regular rhythm.     Heart sounds: Normal heart sounds. No murmur heard. Pulmonary:     Breath sounds: Normal breath sounds.  Abdominal:     Palpations: Abdomen is soft.  Skin:    General: Skin is warm and dry.  Neurological:     General: No focal deficit present.     Mental Status: He  is alert and oriented to person, place, and time.  Psychiatric:        Mood and Affect: Mood normal.        Thought Content: Thought content normal.    Assessment and Plan:  Heart failure with mildly reduced ejection fraction- - NYHA II - weighing daily, reviewed when to call HF clinic, wt gain of 3 lbs/night or 5 lbs/week - discussed low sodium diet, cookbook provided - discussed limiting total fluid intake to ~ 64 ounces/day - on coreg, cozaar, spironolactone since discharge, refills sent - will start jardiance 10 mg QD, coupon provide for free 30 day - medicaid pending, expects it to be approved within "a month or two"; change lisinopril > entresto at that time - will check BMP and CBC per hospital dc instructions - will see cardiology Sharolyn Douglas) 05/21/21 - BNP 04/19/21 233.5  2. HTN- - BP today 138/94 - needs PCP, facilitated referral to [facility] and they will reach out to him to schedule an appt - BMP from 04/21/21 reviewed, Na 137, K 4.6, Cr 0.92, GFR > 60  - repeat BMP today   3. Tobacco abuse- - decreased from 1 ppd > 1/3 ppd - congratulated and encouraged total cessation  4. Sleep apnea- - had CPAP in the past but has moved several times and has not used in several years - sleep study referral placed   Return in 6 weeks, sooner if needed.  Medication bottles were brought and reviewed.

## 2021-05-14 ENCOUNTER — Other Ambulatory Visit
Admission: RE | Admit: 2021-05-14 | Discharge: 2021-05-14 | Disposition: A | Discharge status: Home / Self Care | Payer: Medicaid Other | Attending: Family | Admitting: Family

## 2021-05-14 ENCOUNTER — Other Ambulatory Visit: Payer: Self-pay

## 2021-05-14 ENCOUNTER — Ambulatory Visit (HOSPITAL_BASED_OUTPATIENT_CLINIC_OR_DEPARTMENT_OTHER): Payer: Medicaid Other | Admitting: Family

## 2021-05-14 ENCOUNTER — Encounter: Payer: Self-pay | Admitting: Family

## 2021-05-14 VITALS — BP 138/94 | HR 64 | Resp 16 | Ht 72.0 in | Wt 239.4 lb

## 2021-05-14 DIAGNOSIS — I110 Hypertensive heart disease with heart failure: Secondary | ICD-10-CM | POA: Insufficient documentation

## 2021-05-14 DIAGNOSIS — I5022 Chronic systolic (congestive) heart failure: Secondary | ICD-10-CM

## 2021-05-14 DIAGNOSIS — G4733 Obstructive sleep apnea (adult) (pediatric): Secondary | ICD-10-CM

## 2021-05-14 DIAGNOSIS — J449 Chronic obstructive pulmonary disease, unspecified: Secondary | ICD-10-CM | POA: Insufficient documentation

## 2021-05-14 DIAGNOSIS — I10 Essential (primary) hypertension: Secondary | ICD-10-CM

## 2021-05-14 DIAGNOSIS — Z720 Tobacco use: Secondary | ICD-10-CM

## 2021-05-14 DIAGNOSIS — G4730 Sleep apnea, unspecified: Secondary | ICD-10-CM | POA: Insufficient documentation

## 2021-05-14 DIAGNOSIS — R911 Solitary pulmonary nodule: Secondary | ICD-10-CM | POA: Insufficient documentation

## 2021-05-14 DIAGNOSIS — F17210 Nicotine dependence, cigarettes, uncomplicated: Secondary | ICD-10-CM | POA: Insufficient documentation

## 2021-05-14 LAB — CBC WITH DIFFERENTIAL/PLATELET
Abs Immature Granulocytes: 0.02 10*3/uL (ref 0.00–0.07)
Basophils Absolute: 0 10*3/uL (ref 0.0–0.1)
Basophils Relative: 1 %
Eosinophils Absolute: 0.2 10*3/uL (ref 0.0–0.5)
Eosinophils Relative: 4 %
HCT: 45.9 % (ref 39.0–52.0)
Hemoglobin: 14.4 g/dL (ref 13.0–17.0)
Immature Granulocytes: 0 %
Lymphocytes Relative: 29 %
Lymphs Abs: 1.7 10*3/uL (ref 0.7–4.0)
MCH: 27.6 pg (ref 26.0–34.0)
MCHC: 31.4 g/dL (ref 30.0–36.0)
MCV: 87.9 fL (ref 80.0–100.0)
Monocytes Absolute: 0.6 10*3/uL (ref 0.1–1.0)
Monocytes Relative: 9 %
Neutro Abs: 3.4 10*3/uL (ref 1.7–7.7)
Neutrophils Relative %: 57 %
Platelets: 412 10*3/uL — ABNORMAL HIGH (ref 150–400)
RBC: 5.22 MIL/uL (ref 4.22–5.81)
RDW: 15.8 % — ABNORMAL HIGH (ref 11.5–15.5)
WBC: 6 10*3/uL (ref 4.0–10.5)
nRBC: 0 % (ref 0.0–0.2)

## 2021-05-14 LAB — BASIC METABOLIC PANEL
Anion gap: 7 (ref 5–15)
BUN: 25 mg/dL — ABNORMAL HIGH (ref 6–20)
CO2: 27 mmol/L (ref 22–32)
Calcium: 9.3 mg/dL (ref 8.9–10.3)
Chloride: 107 mmol/L (ref 98–111)
Creatinine, Ser: 1.08 mg/dL (ref 0.61–1.24)
GFR, Estimated: >60 mL/min (ref >60)
Glucose, Bld: 121 mg/dL — ABNORMAL HIGH (ref 70–99)
Potassium: 5 mmol/L (ref 3.5–5.1)
Sodium: 141 mmol/L (ref 135–145)

## 2021-05-14 MED ORDER — CARVEDILOL 25 MG PO TABS: 25.0000 mg | ORAL_TABLET | Freq: Two times a day (BID) | ORAL | 5 refills | Status: DC

## 2021-05-14 MED ORDER — LOSARTAN POTASSIUM 100 MG PO TABS: 100.0000 mg | ORAL_TABLET | Freq: Every day | ORAL | 5 refills | Status: DC

## 2021-05-14 MED ORDER — EMPAGLIFLOZIN 10 MG PO TABS
10.0000 mg | ORAL_TABLET | Freq: Every day | ORAL | 5 refills | Status: DC
  Filled 2021-09-24: qty 30, 30d supply, fill #0

## 2021-05-14 MED ORDER — ATORVASTATIN CALCIUM 40 MG PO TABS
40.0000 mg | ORAL_TABLET | Freq: Every day | ORAL | 5 refills | Status: DC
  Filled 2021-08-08: qty 30, 30d supply, fill #0
  Filled 2021-09-24: qty 30, 30d supply, fill #1
  Filled 2021-10-20: qty 30, 30d supply, fill #2

## 2021-05-14 MED ORDER — SPIRONOLACTONE 25 MG PO TABS: 25.0000 mg | ORAL_TABLET | Freq: Every day | ORAL | 5 refills | Status: DC

## 2021-05-14 NOTE — Patient Instructions (Addendum)
[facility] will call you with a primary care appointment.  ? ?Continue to weigh daily and check your BP. ? ?Call us if you have a weight gain of 3 lbs/night OR 5 lbs/week.  ? ?Continue to try to cut back your tobacco and hard liquor use. ? ?We will check blood work today. ? ?Return to the heart failure clinic in 6 weeks.  ? ? ?

## 2021-05-21 ENCOUNTER — Ambulatory Visit (INDEPENDENT_AMBULATORY_CARE_PROVIDER_SITE_OTHER): Payer: Self-pay | Admitting: Nurse Practitioner

## 2021-05-21 ENCOUNTER — Other Ambulatory Visit: Payer: Self-pay

## 2021-05-21 ENCOUNTER — Encounter: Payer: Self-pay | Admitting: Nurse Practitioner

## 2021-05-21 VITALS — BP 130/84 | HR 71 | Ht 72.0 in | Wt 239.0 lb

## 2021-05-21 DIAGNOSIS — R911 Solitary pulmonary nodule: Secondary | ICD-10-CM

## 2021-05-21 DIAGNOSIS — I5042 Chronic combined systolic (congestive) and diastolic (congestive) heart failure: Secondary | ICD-10-CM

## 2021-05-21 DIAGNOSIS — F17200 Nicotine dependence, unspecified, uncomplicated: Secondary | ICD-10-CM | POA: Insufficient documentation

## 2021-05-21 DIAGNOSIS — E782 Mixed hyperlipidemia: Secondary | ICD-10-CM | POA: Insufficient documentation

## 2021-05-21 DIAGNOSIS — G4733 Obstructive sleep apnea (adult) (pediatric): Secondary | ICD-10-CM | POA: Insufficient documentation

## 2021-05-21 DIAGNOSIS — I10 Essential (primary) hypertension: Secondary | ICD-10-CM

## 2021-05-21 DIAGNOSIS — Z720 Tobacco use: Secondary | ICD-10-CM | POA: Insufficient documentation

## 2021-05-21 DIAGNOSIS — I340 Nonrheumatic mitral (valve) insufficiency: Secondary | ICD-10-CM | POA: Insufficient documentation

## 2021-05-21 DIAGNOSIS — I429 Cardiomyopathy, unspecified: Secondary | ICD-10-CM

## 2021-05-21 DIAGNOSIS — F1010 Alcohol abuse, uncomplicated: Secondary | ICD-10-CM | POA: Insufficient documentation

## 2021-05-21 NOTE — Progress Notes (Signed)
Office Visit    Patient Name: Nicholas Chung Date of Encounter: 05/21/2021  Primary Care Provider:  Pcp, No Primary Cardiologist:  Lorine Bears, MD  Chief Complaint    56 year old male with a history of hypertension, bladder cancer, and tobacco and alcohol use, who presents for follow-up after hospitalization in February 2023 for respiratory failure/COPD, with finding of mild troponin elevation and cardiomyopathy with an EF of 40 to 45%.  Past Medical History    Past Medical History:  Diagnosis Date   Alcohol abuse    Bladder cancer (HCC)    Cardiomyopathy (HCC)    a. 04/2021 Echo: EF 40-45%, mild glob HK, sev apical HK, GrII DD.  Pt declined ischemic w/u.   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 04/2021 Echo: EF 40-45%, mild glob HK, sev apical HK, GrII DD, nl RV fxn, mildly dil LA, mod MR.   Hypertension    Mixed hyperlipidemia    Moderate mitral regurgitation    OSA (obstructive sleep apnea)    Right lower lobe pulmonary nodule    a. 04/2021 CTA Chest: 13x10 mm nodular density noted laterally in the right lower lobe-recommend repeat chest CT, PET/CT, or tissue sampling in 3 months.   Tobacco abuse    Past Surgical History:  Procedure Laterality Date   REPLACEMENT TOTAL KNEE Left     Allergies  No Known Allergies  History of Present Illness    56 year old male with above past medical history including hypertension, bladder cancer, and tobacco and alcohol use.  He was admitted to [facility] in mid February with progressive dyspnea and intermittent chest tightness.  Troponin was elevated at 61 with subsequent downtrend.  He was treated for acute respiratory failure with steroids and nebulizers.  Echocardiogram showed an EF of 40 to 45% with severe apical hypokinesis and grade 2 diastolic dysfunction.  Moderate mitral regurgitation was also noted.  Initially, recommendation was made for right and left heart cardiac catheterization  however, patient declined and instead, he was discharged home on medical therapy with recommendation for close outpatient follow-up and consideration of testing at some point in the future.  Of note, he did have a CTA of the chest during admission that was negative for PE but did show a 13 x 10 mm nodular density noted laterally in the right lower lobe with recommendation for follow-up CT, PET/CT, or tissue sampling in 3 months.  Mr. Creson was last seen in cardiology clinic on February 19, at which time he was doing well.  He continued to decline further work-up of cardiomyopathy and demand ischemia in the setting of lack of insurance.  At March 14 heart failure clinic appointment, he was relatively stable and Jardiance was added.  He notes today, that he has not yet started Jardiance because he did not think he needed it.  He complains of ongoing dyspnea on exertion with minimal activity.  He does not experience chest pain and denies palpitations, PND, orthopnea, syncope, edema, or early satiety.  He has sometimes noted positional lightheadedness.  He is upset with his financial situation and lack of insurance.  His spouse has indicated that they are trying to get on Medicaid and also seeking financial assistance through [facility].  Patient and spouse understand that we would like to pursue an ischemic evaluation.  We also discussed the need for follow-up CT of the chest in the setting of pulmonary nodules noted in February.  Home Medications  Current Outpatient Medications  Medication Sig Dispense Refill   albuterol (VENTOLIN HFA) 108 (90 Base) MCG/ACT inhaler Inhale 2 puffs into the lungs every 6 (six) hours as needed for wheezing or shortness of breath. 8 g 2   aspirin EC 81 MG EC tablet Take 1 tablet (81 mg total) by mouth daily. Swallow whole. 30 tablet 0   atorvastatin (LIPITOR) 40 MG tablet Take 1 tablet (40 mg total) by mouth daily. 30 tablet 5   carvedilol (COREG) 25 MG tablet Take 1 tablet (25 mg  total) by mouth 2 (two) times daily. 60 tablet 5   losartan (COZAAR) 100 MG tablet Take 1 tablet (100 mg total) by mouth daily. 30 tablet 5   spironolactone (ALDACTONE) 25 MG tablet Take 1 tablet (25 mg total) by mouth daily. 30 tablet 5   empagliflozin (JARDIANCE) 10 MG TABS tablet Take 1 tablet (10 mg total) by mouth daily before breakfast. (Patient not taking: Reported on 05/21/2021) 30 tablet 5   ipratropium (ATROVENT HFA) 17 MCG/ACT inhaler Inhale 1 puff into the lungs 3 (three) times daily. (Patient not taking: Reported on 05/21/2021) 1 each 2   No current facility-administered medications for this visit.     Review of Systems    Has had some orthostatic lightheadedness.  Continues to experience dyspnea exertion with minimal activity.  He denies chest pain, palpitations, PND, orthopnea, syncope, edema, or early satiety.  All other systems reviewed and are otherwise negative except as noted above.    Physical Exam    VS:  BP 130/84 (BP Location: Left Arm, Patient Position: Sitting, Cuff Size: Normal)   Pulse 71   Ht 6' (1.829 m)   Wt 239 lb (108.4 kg)   SpO2 98%   BMI 32.41 kg/m  , BMI Body mass index is 32.41 kg/m.    Orthostatic VS for the past 24 hrs:  BP- Lying Pulse- Lying BP- Sitting Pulse- Sitting BP- Standing at 0 minutes Pulse- Standing at 0 minutes  05/21/21 1122 (!) 137/92 71 130/83 70 114/79 69     GEN: Well nourished, well developed, in no acute distress. HEENT: normal. Neck: Supple, no JVD, carotid bruits, or masses. Cardiac: RRR, no murmurs, rubs, or gallops. No clubbing, cyanosis, edema.  Radials/PT 2+ and equal bilaterally.  Respiratory:  Respirations regular and unlabored, clear to auscultation bilaterally. GI: Soft, nontender, nondistended, BS + x 4. MS: no deformity or atrophy. Skin: warm and dry, no rash. Neuro:  Strength and sensation are intact. Psych: Normal affect.  Accessory Clinical Findings    ECG personally reviewed by me today -regular sinus  rhythm, 71, left axis deviation, left anterior fascicular block, septal infarct- no acute changes.  Lab Results  Component Value Date   WBC 6.0 05/14/2021   HGB 14.4 05/14/2021   HCT 45.9 05/14/2021   MCV 87.9 05/14/2021   PLT 412 (H) 05/14/2021   Lab Results  Component Value Date   CREATININE 1.08 05/14/2021   BUN 25 (H) 05/14/2021   NA 141 05/14/2021   K 5.0 05/14/2021   CL 107 05/14/2021   CO2 27 05/14/2021   Lab Results  Component Value Date   ALT 25 04/21/2021   AST 22 04/21/2021   ALKPHOS 75 04/21/2021   BILITOT 0.5 04/21/2021   Lab Results  Component Value Date   CHOL 216 (H) 04/21/2021   HDL 46 04/21/2021   LDLCALC 148 (H) 04/21/2021   TRIG 110 04/21/2021   CHOLHDL 4.7 04/21/2021    Lab  Results  Component Value Date   HGBA1C 5.6 04/20/2021    Assessment & Plan    1.  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  He was offered right and left heart catheterization at that time but declined.  Since hospital discharge, he has tolerated medications reasonably well though occasionally notes orthostatic lightheadedness.  He was recently placed on Jardiance but has not started this yet.  I encouraged him to start Jardiance and we will refer him to med management clinic for assistance.  He is very frustrated by his lack of insurance and inability to pursue further work-up.  We discussed the importance of an ischemic evaluation in the setting of cardiomyopathy.  Understandably, he does not want to pay out of pocket.  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  He remains on carvedilol, losartan, and spironolactone.  His potassium was on the high end of normal last week at 5.0 and I will arrange for follow-up basic metabolic panel in about 2  weeks.  Given some orthostatic lightheadedness, which she can manage by getting up more deliberately, I will hold off on switching losartan to Entresto.  2.  Essential hypertension: Significant improved since hospitalization.  As above, having some orthostatic lightheadedness.  We will continue current medications and have encouraged him to change positions more deliberately.  If necessary, we may have to come back down on his spironolactone dose.  3.  Mixed hyperlipidemia: Recent LDL of 148 in February.  He was statin nave at that time.  Currently on atorvastatin.  4.  Tobacco abuse: Still smoking 3 cigarettes a day, which is a significant reduction.  Encouraged him to quit completely.  5.  Alcohol abuse: Down from a 40 ounce liquor daily to once or twice a week.  Complete cessation advised.  6.  Right lower lobe pulmonary nodule: Noted on CT during hospitalization with recommendation for 63-month follow-up imaging.  We discussed this today.  He says he was unaware of this.  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  7.  Moderate mitral regurgitation: Noted on echocardiogram in February.  8.  Obstructive sleep apnea: Patient notes prior history of obstructive sleep apnea and says that he was on CPAP in the past.  It has been many years since he last used this.  He understands that he will require repeat sleep study though given his lack of insurance, he is not interested in pursuing at this time.  9.  Disposition: Follow-up basic metabolic panel in 2 weeks.  He has follow-up in heart failure clinic in 1 month.  We will plan to see him back here in 2 to 3 months.  He did express that he might consider canceling all of his appointments due to lack of insurance.  I discouraged him from doing this.   Nicolasa Ducking, NP 05/21/2021, 12:32 PM

## 2021-05-21 NOTE — Patient Instructions (Addendum)
Medication Instructions:  ? ?Your physician recommends that you continue on your current medications as directed. Please refer to the Current Medication list given to you today. ? ?*If you need a refill on your cardiac medications before your next appointment, please call your pharmacy* ? ?Application given today for Medication Management. Follow instructions on handout provided.  ? ? ?Lab Work: ? ?Your physician recommends that you return for lab work in: [place] (BMET) ?  - This lab is not fasting ? ?Testing/Procedures: ? ?None ordered ? ? ?Follow-Up: ?At [facility], you and your health needs are our priority.  As part of our continuing mission to provide you with exceptional heart care, we have created designated Provider Care Teams.  These Care Teams include your primary Cardiologist (physician) and Advanced Practice Providers (APPs -  Physician Assistants and Nurse Practitioners) who all work together to provide you with the care you need, when you need it. ? ?We recommend signing up for the patient portal called "MyChart".  Sign up information is provided on this After Visit Summary.  MyChart is used to connect with patients for Virtual Visits (Telemedicine).  Patients are able to view lab/test results, encounter notes, upcoming appointments, etc.  Non-urgent messages can be sent to your provider as well.   ?To learn more about what you can do with MyChart, go to [email or web address].   ? ?Your next appointment:   ? ?Keep follow up with Darylene Price, NP in 1 month ? ?3 month(s) with Dr. Fletcher Anon or Ignacia Bayley, NP ? ?The format for your next appointment:   ?In Person ? ?Provider:   ?You may see Kathlyn Sacramento, MD or one of the following Advanced Practice Providers on your designated Care Team:   ?Murray Hodgkins, NP ? ? ?

## 2021-06-04 ENCOUNTER — Telehealth: Payer: Self-pay | Admitting: *Deleted

## 2021-06-04 ENCOUNTER — Other Ambulatory Visit
Admission: RE | Admit: 2021-06-04 | Discharge: 2021-06-04 | Disposition: A | Discharge status: Home / Self Care | Payer: Medicaid Other | Source: Ambulatory Visit | Attending: Nurse Practitioner | Admitting: Nurse Practitioner

## 2021-06-04 ENCOUNTER — Encounter: Payer: Self-pay | Admitting: Emergency Medicine

## 2021-06-04 ENCOUNTER — Emergency Department
Admission: EM | Admit: 2021-06-04 | Discharge: 2021-06-04 | Disposition: A | Discharge status: Home / Self Care | Payer: Medicaid Other | Attending: Emergency Medicine | Admitting: Emergency Medicine

## 2021-06-04 ENCOUNTER — Other Ambulatory Visit: Payer: Self-pay

## 2021-06-04 DIAGNOSIS — I10 Essential (primary) hypertension: Secondary | ICD-10-CM | POA: Insufficient documentation

## 2021-06-04 DIAGNOSIS — R799 Abnormal finding of blood chemistry, unspecified: Secondary | ICD-10-CM | POA: Insufficient documentation

## 2021-06-04 DIAGNOSIS — Z5321 Procedure and treatment not carried out due to patient leaving prior to being seen by health care provider: Secondary | ICD-10-CM | POA: Insufficient documentation

## 2021-06-04 DIAGNOSIS — I5042 Chronic combined systolic (congestive) and diastolic (congestive) heart failure: Secondary | ICD-10-CM | POA: Insufficient documentation

## 2021-06-04 DIAGNOSIS — E875 Hyperkalemia: Secondary | ICD-10-CM | POA: Insufficient documentation

## 2021-06-04 LAB — CBC
HCT: 43.8 % (ref 39.0–52.0)
Hemoglobin: 13.9 g/dL (ref 13.0–17.0)
MCH: 28.3 pg (ref 26.0–34.0)
MCHC: 31.7 g/dL (ref 30.0–36.0)
MCV: 89 fL (ref 80.0–100.0)
Platelets: 248 10*3/uL (ref 150–400)
RBC: 4.92 MIL/uL (ref 4.22–5.81)
RDW: 15.6 % — ABNORMAL HIGH (ref 11.5–15.5)
WBC: 7.7 10*3/uL (ref 4.0–10.5)
nRBC: 0 % (ref 0.0–0.2)

## 2021-06-04 LAB — BASIC METABOLIC PANEL
Anion gap: 7 (ref 5–15)
Anion gap: 9 (ref 5–15)
BUN: 24 mg/dL — ABNORMAL HIGH (ref 6–20)
BUN: 18 mg/dL (ref 6–20)
CO2: 28 mmol/L (ref 22–32)
CO2: 23 mmol/L (ref 22–32)
Calcium: 10.1 mg/dL (ref 8.9–10.3)
Calcium: 9.9 mg/dL (ref 8.9–10.3)
Chloride: 108 mmol/L (ref 98–111)
Chloride: 111 mmol/L (ref 98–111)
Creatinine, Ser: 1.05 mg/dL (ref 0.61–1.24)
Creatinine, Ser: 1.06 mg/dL (ref 0.61–1.24)
GFR, Estimated: >60 mL/min (ref >60)
GFR, Estimated: >60 mL/min (ref >60)
Glucose, Bld: 120 mg/dL — ABNORMAL HIGH (ref 70–99)
Glucose, Bld: 76 mg/dL (ref 70–99)
Potassium: 5.7 mmol/L — ABNORMAL HIGH (ref 3.5–5.1)
Potassium: 4 mmol/L (ref 3.5–5.1)
Sodium: 143 mmol/L (ref 135–145)
Sodium: 143 mmol/L (ref 135–145)

## 2021-06-04 NOTE — Telephone Encounter (Signed)
-----   Message from Rise Mu, PA-C sent at 06/04/2021  1:46 PM EDT ----- ?Please inform the patient his potassium level is higher. ? ?Recommendations: ?-Stop spironolactone and losartan ?-Send in Lokelma 10 g p.o. x1 ?-Recheck potassium level 24 hours after taking Lokelma ?

## 2021-06-04 NOTE — Telephone Encounter (Signed)
Spoke with patient and reviewed results and recommendations. Advised I would call in prescription and he should take this and have repeat labs done. He then told me about having 2 falls yesterday. Last night he fell, hit his head, and BP was 90/55. Advised I would review with provider and give him a call back.  ? ?Discussed with provider and given his elevated potassium, low blood pressure, and fall to proceed to ED for further evaluation. He verbalized understanding but states he has other obligations at this time and will report there tomorrow. Expressed importance of these recommendations and encouraged him to go today. He verbalized understanding of our conversation with no further questions at this time.  ?

## 2021-06-04 NOTE — ED Triage Notes (Signed)
Pt to ED from home c/o abnormal lab done at PCP.  States was having follow up lab work for high potassium, today potassium was 5.5 and told to come to ED immediately.  Denies pain or other symptoms.  Pt A&Ox4, chest rise even and unlabored, skin WNL and in NAD at this time. ?

## 2021-06-24 NOTE — Progress Notes (Deleted)
   Patient ID: Nicholas Chung, male    DOB: [birth year], 56 y.o.   MRN: [id number]  HPI  Nicholas Chung is a 56 yo male patient with a PMHx of HTN, tobacco abuse, COPD, combined HF, and pulmonary nodules.  Echo report from 04/19/21 reviewed and showed an EF of 40 to 45%. The left ventricle has mildly decreased function. The left ventricle demonstrates global hypokinesis.  Went to the ED 06/04/21 but LWBS. Admitted 2/17-2/19/23 for worsening SOB and COPD exacerbation. Troponins were elevated and cardiology was consulted, recommending a R/L HC, but he ultimately declined and was discharged home.  He presents today for an initial visit to the heart failure clinic with a c/c of moderate fatigue with moderate exertion. He also endorses snoring, SOB with exertion. He denies CP, palpitation, dizziness, cough, syncope, pedal edema, weight gain or orthopnea.  Since his discharge, he has been weighing daily, checking his BP and taking all medications as prescribed at hospital discharge.   Review of Systems    Physical Exam    Assessment and Plan:  Heart failure with mildly reduced ejection fraction- - NYHA II - weighing daily, reviewed when to call HF clinic, wt gain of 3 lbs/night or 5 lbs/week - discussed low sodium diet, cookbook provided - discussed limiting total fluid intake to ~ 64 ounces/day - on coreg, cozaar, spironolactone since discharge, refills sent - will start jardiance 10 mg QD, coupon provide for free 30 day - medicaid pending, expects it to be approved within "a month or two"; change lisinopril > entresto at that time - will check BMP and CBC per hospital dc instructions - will see cardiology Sharolyn Douglas) 05/21/21 - BNP 04/19/21 233.5  2. HTN- - BP today 138/94 - needs PCP, facilitated referral to [facility] and they will reach out to him to schedule an appt - BMP from 04/21/21 reviewed, Na 137, K 4.6, Cr 0.92, GFR > 60  - repeat BMP today   3. Tobacco abuse- - decreased from 1 ppd  > 1/3 ppd - congratulated and encouraged total cessation  4. Sleep apnea- - had CPAP in the past but has moved several times and has not used in several years - sleep study referral placed   Return in 6 weeks, sooner if needed.  Medication bottles were brought and reviewed.

## 2021-06-25 ENCOUNTER — Ambulatory Visit: Payer: Medicaid Other | Admitting: Family

## 2021-07-12 ENCOUNTER — Emergency Department
Admission: EM | Admit: 2021-07-12 | Discharge: 2021-07-12 | Discharge status: Against Medical Advice | Payer: Medicaid Other | Attending: Emergency Medicine | Admitting: Emergency Medicine

## 2021-07-12 ENCOUNTER — Emergency Department: Payer: Medicaid Other

## 2021-07-12 DIAGNOSIS — R296 Repeated falls: Secondary | ICD-10-CM | POA: Insufficient documentation

## 2021-07-12 DIAGNOSIS — Z5321 Procedure and treatment not carried out due to patient leaving prior to being seen by health care provider: Secondary | ICD-10-CM | POA: Insufficient documentation

## 2021-07-12 DIAGNOSIS — R42 Dizziness and giddiness: Secondary | ICD-10-CM | POA: Insufficient documentation

## 2021-07-12 DIAGNOSIS — R079 Chest pain, unspecified: Secondary | ICD-10-CM | POA: Insufficient documentation

## 2021-07-12 LAB — CBC
HCT: 45 % (ref 39.0–52.0)
Hemoglobin: 15.1 g/dL (ref 13.0–17.0)
MCH: 30.3 pg (ref 26.0–34.0)
MCHC: 33.6 g/dL (ref 30.0–36.0)
MCV: 90.2 fL (ref 80.0–100.0)
Platelets: 285 10*3/uL (ref 150–400)
RBC: 4.99 MIL/uL (ref 4.22–5.81)
RDW: 14.3 % (ref 11.5–15.5)
WBC: 10 10*3/uL (ref 4.0–10.5)
nRBC: 0 % (ref 0.0–0.2)

## 2021-07-12 LAB — BASIC METABOLIC PANEL
Anion gap: 10 (ref 5–15)
BUN: 26 mg/dL — ABNORMAL HIGH (ref 6–20)
CO2: 22 mmol/L (ref 22–32)
Calcium: 9.8 mg/dL (ref 8.9–10.3)
Chloride: 111 mmol/L (ref 98–111)
Creatinine, Ser: 1.19 mg/dL (ref 0.61–1.24)
GFR, Estimated: >60 mL/min (ref >60)
Glucose, Bld: 101 mg/dL — ABNORMAL HIGH (ref 70–99)
Potassium: 3.7 mmol/L (ref 3.5–5.1)
Sodium: 143 mmol/L (ref 135–145)

## 2021-07-12 LAB — TROPONIN I (HIGH SENSITIVITY): Troponin I (High Sensitivity): 25 ng/L — ABNORMAL HIGH (ref <18)

## 2021-07-12 LAB — CK: Total CK: 170 U/L (ref 49–397)

## 2021-07-12 NOTE — ED Triage Notes (Signed)
Pt called for triage and his wife rushed to the first nurse desk stating that the patient had fallen in the bathroom. This RN & first nurse (stephen) went to assist the patient who was laying on his side and rolling around on the ground. No visible injuries and patient was able to stand and get into the wheelchair with minimal assistance.  ? ?Pt presents via POV with complaints of dizziness, multiple falls, and left sided CP. Pt smells of ETOH - stating he drank a pint crown over the course of the day and was outside working on his pool. Denies SOB. ?

## 2021-07-12 NOTE — ED Notes (Signed)
Pt discussed with Dr. Jari Pigg - see orders for intervention. ?

## 2021-08-03 NOTE — Progress Notes (Signed)
Patient ID: Nicholas Chung, male    DOB: 25-Oct-1965, 56 y.o.   MRN: [id number]  HPI  Nicholas Chung is a 56 yo male patient with a PMHx of HTN, tobacco abuse, COPD, combined HF, and pulmonary nodules.  Echo report from 04/19/21 reviewed and showed an EF of 40 to 45%. The left ventricle has mildly decreased function. The left ventricle demonstrates global hypokinesis.  Was in the ED 07/12/21 & 06/04/21 but LWBS both times. Admitted 2/17-2/19/23 for worsening SOB and COPD exacerbation. Troponins were elevated and cardiology was consulted, recommending a R/L HC, but he ultimately declined and was discharged home.  He presents today for a follow-up visit with a chief complaint of minimal SOB with moderate exertion. Describes this as chronic in nature. He has associated fatigue and occasional light-headedness along with this. He denies any difficulty sleeping, abdominal distention, palpitations, pedal edema, chest pain, cough or weight gain.   Had only been taking his carvedilol once daily because he didn't realize it needed to be BID. Started taking it BID 3 days ago. Picked up the jardiance and will start taking it tomorrow.  He's concerned because his insurance may no longer be effective and he's concerned about the cost of the jardiance.   Has had some issues at home where his blood pressure has gotten low and he's become unsteady. He says that he thinks it happens when he gets up too quickly. Home BP readings have been normal without any low readings.   Past Medical History:  Diagnosis Date   Alcohol abuse    Bladder cancer ([place])    Cardiomyopathy ([place])    a. 04/2021 Echo: EF 40-45%, mild glob HK, sev apical HK, GrII DD.  Pt declined ischemic w/u.   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 04/2021 Echo: EF 40-45%, mild glob HK, sev apical HK, GrII DD, nl RV fxn, mildly dil LA, mod MR.   Hypertension    Mixed hyperlipidemia    Moderate mitral regurgitation    OSA  (obstructive sleep apnea)    Right lower lobe pulmonary nodule    a. 04/2021 CTA Chest: 13x10 mm nodular density noted laterally in the right lower lobe-recommend repeat chest CT, PET/CT, or tissue sampling in 3 months.   Tobacco abuse    Past Surgical History:  Procedure Laterality Date   REPLACEMENT TOTAL KNEE Left    History reviewed. No pertinent family history. Social History   Tobacco Use   Smoking status: Every Day    Packs/day: 1.00    Types: Cigarettes   Smokeless tobacco: Never  Substance Use Topics   Alcohol use: Yes    Comment: 2 pints/week   No Known Allergies Prior to Admission medications   Medication Sig Start Date End Date Taking? Authorizing Provider  aspirin EC 81 MG EC tablet Take 1 tablet (81 mg total) by mouth daily. Swallow whole. 04/21/21  Yes Amin, Jeanella Flattery, MD  atorvastatin (LIPITOR) 40 MG tablet Take 1 tablet (40 mg total) by mouth daily. 05/14/21  Yes Darylene Price A, FNP  carvedilol (COREG) 25 MG tablet Take 1 tablet (25 mg total) by mouth 2 (two) times daily. 05/14/21 08/05/21 Yes Barack Nicodemus, Otila Kluver A, FNP  losartan (COZAAR) 100 MG tablet Take 100 mg by mouth daily. 07/15/21  Yes [provider]  spironolactone (ALDACTONE) 25 MG tablet Take 25 mg by mouth daily. 07/15/21  Yes [provider]  albuterol (VENTOLIN HFA) 108 (90 Base) MCG/ACT  inhaler Inhale 2 puffs into the lungs every 6 (six) hours as needed for wheezing or shortness of breath. Patient not taking: Reported on 08/05/2021 04/21/21   Damita Lack, MD  empagliflozin (JARDIANCE) 10 MG TABS tablet Take 1 tablet (10 mg total) by mouth daily before breakfast. Patient not taking: Reported on 08/05/2021 05/14/21   Darylene Price A, FNP  ipratropium (ATROVENT HFA) 17 MCG/ACT inhaler Inhale 1 puff into the lungs 3 (three) times daily. Patient not taking: Reported on 05/21/2021 04/21/21 04/21/22  Damita Lack, MD   Review of Systems  Constitutional:  Positive for fatigue. Negative for  appetite change.  HENT:  Negative for congestion, postnasal drip and sore throat.   Eyes: Negative.   Respiratory:  Positive for shortness of breath (minimal). Negative for cough and wheezing.   Cardiovascular:  Negative for chest pain, palpitations and leg swelling.  Gastrointestinal:  Negative for abdominal distention and abdominal pain.  Endocrine: Negative.   Genitourinary: Negative.   Musculoskeletal:  Negative for back pain and neck pain.  Skin: Negative.   Allergic/Immunologic: Negative.   Neurological:  Positive for light-headedness. Negative for dizziness.  Hematological:  Negative for adenopathy. Does not bruise/bleed easily.  Psychiatric/Behavioral:  Negative for dysphoric mood and sleep disturbance (sleeping on 1 pillow). The patient is not nervous/anxious.    Vitals:   08/05/21 1308  BP: (!) 148/82  Pulse: 62  Resp: 20  SpO2: 99%  Weight: 234 lb (106.1 kg)  Height: 6' (1.829 m)   Wt Readings from Last 3 Encounters:  08/05/21 234 lb (106.1 kg)  06/04/21 240 lb (108.9 kg)  05/21/21 239 lb (108.4 kg)   Lab Results  Component Value Date   CREATININE 1.19 07/12/2021   CREATININE 1.06 06/04/2021   CREATININE 1.05 06/04/2021   Physical Exam Vitals and nursing note reviewed.  Constitutional:      Appearance: He is well-developed.  HENT:     Head: Normocephalic and atraumatic.  Cardiovascular:     Rate and Rhythm: Normal rate and regular rhythm.  Pulmonary:     Effort: Pulmonary effort is normal.     Breath sounds: No wheezing, rhonchi or rales.  Abdominal:     Palpations: Abdomen is soft.     Tenderness: There is no abdominal tenderness.  Musculoskeletal:     Cervical back: Normal range of motion and neck supple.     Right lower leg: No tenderness.     Left lower leg: No tenderness. No edema.  Skin:    General: Skin is warm and dry.  Neurological:     General: No focal deficit present.     Mental Status: He is alert and oriented to person, place, and  time.  Psychiatric:        Mood and Affect: Mood normal.        Behavior: Behavior normal.    Assessment and Plan:  Heart failure with mildly reduced ejection fraction- - NYHA II - weighing daily, reviewed when to call HF clinic, wt gain of 3 lbs/night or 5 lbs/week - weight down 5 pounds from last visit here 2.5 months ago - not adding salt and tries to eat low sodium foods - just started carvedilol BID 3 days ago because he didn't realize it was BID - has jardiance picked up and will be starting it - check BMP next visit if not done elsewhere - brochure provided on [facility] Management [facility] to assist with medications - discussed limiting total fluid intake to ~  38 ounces/day - on GDMT of coreg, cozaar and spironolactone - saw cardiology Sharolyn Douglas) 05/21/21 - BNP 04/19/21 233.5 - PharmD reconciled medications  2. HTN- - BP mildly elevated (148/82) but he reports some orthostasis at home - monitor BP with starting jardiance and if he develops low readings, could decrease losartan/spironoalctone - to see PCP Rosana Berger) 08/08/21 - BMP from 07/12/21 reviewed, Na 143, K 3.7, Cr 1.19, GFR > 60   3. Tobacco abuse- - decreased from 1 ppd > 1/3 ppd - congratulated and encouraged total cessation   Medication list reviewed.   Return in 2 months, sooner if needed

## 2021-08-05 ENCOUNTER — Encounter: Payer: Self-pay | Admitting: Family

## 2021-08-05 ENCOUNTER — Ambulatory Visit: Payer: Medicaid Other | Attending: Family | Admitting: Family

## 2021-08-05 VITALS — BP 148/82 | HR 62 | Resp 20 | Ht 72.0 in | Wt 234.0 lb

## 2021-08-05 DIAGNOSIS — F17210 Nicotine dependence, cigarettes, uncomplicated: Secondary | ICD-10-CM | POA: Insufficient documentation

## 2021-08-05 DIAGNOSIS — I509 Heart failure, unspecified: Secondary | ICD-10-CM | POA: Insufficient documentation

## 2021-08-05 DIAGNOSIS — I110 Hypertensive heart disease with heart failure: Secondary | ICD-10-CM | POA: Insufficient documentation

## 2021-08-05 DIAGNOSIS — I10 Essential (primary) hypertension: Secondary | ICD-10-CM

## 2021-08-05 DIAGNOSIS — J441 Chronic obstructive pulmonary disease with (acute) exacerbation: Secondary | ICD-10-CM | POA: Insufficient documentation

## 2021-08-05 DIAGNOSIS — Z720 Tobacco use: Secondary | ICD-10-CM

## 2021-08-05 DIAGNOSIS — Z79899 Other long term (current) drug therapy: Secondary | ICD-10-CM | POA: Insufficient documentation

## 2021-08-05 DIAGNOSIS — I5022 Chronic systolic (congestive) heart failure: Secondary | ICD-10-CM

## 2021-08-05 NOTE — Patient Instructions (Signed)
Continue weighing daily and call for an overnight weight gain of 3 pounds or more or a weekly weight gain of more than 5 pounds.   If you have voicemail, please make sure your mailbox is cleaned out so that we may leave a message and please make sure to listen to any voicemails.     

## 2021-08-05 NOTE — Progress Notes (Signed)
[place] - PHARMACIST COUNSELING NOTE  Guideline-Directed Medical Therapy/Evidence Based Medicine  ACE/ARB/ARNI: Losartan 100 mg daily Beta Blocker: Carvedilol 25 mg twice daily Aldosterone Antagonist: Spironolactone 25 mg daily Diuretic: none SGLT2i: Empagliflozin 10 mg daily  Adherence Assessment  Do you ever forget to take your medication? '[x]'$ Yes '[]'$ No  Do you ever skip doses due to side effects? '[]'$ Yes '[x]'$ No  Do you have trouble affording your medicines? '[]'$ Yes '[x]'$ No  Are you ever unable to pick up your medication due to transportation difficulties? '[]'$ Yes '[x]'$ No  Do you ever stop taking your medications because you don't believe they are helping? '[]'$ Yes '[x]'$ No  Do you check your weight daily? '[]'$ Yes '[x]'$ No   Adherence strategy: time of day. Has been missing afternoon dose of carvedilol  Barriers to obtaining medications: n/a  Vital signs: HR 62, BP 148/82, weight (pounds) 234 lb ECHO: Date 04/29/21, EF 40-45%, notes grade II diastolic dysfunction     Latest Ref Rng & Units 07/12/2021    7:22 PM 06/04/2021    8:32 PM 06/04/2021    1:08 PM  BMP  Glucose 70 - 99 mg/dL 101   76   120    BUN 6 - 20 mg/dL '26   18   24    '$ Creatinine 0.61 - 1.24 mg/dL 1.19   1.06   1.05    Sodium 135 - 145 mmol/L 143   143   143    Potassium 3.5 - 5.1 mmol/L 3.7   4.0   5.7    Chloride 98 - 111 mmol/L 111   111   108    CO2 22 - 32 mmol/L '22   23   28    '$ Calcium 8.9 - 10.3 mg/dL 9.8   9.9   10.1      Past Medical History:  Diagnosis Date   Alcohol abuse    Bladder cancer ([place])    Cardiomyopathy ([place])    a. 04/2021 Echo: EF 40-45%, mild glob HK, sev apical HK, GrII DD.  Pt declined ischemic w/u.   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 04/2021 Echo: EF 40-45%, mild glob HK, sev apical HK, GrII DD, nl RV fxn, mildly dil LA, mod MR.   Hypertension    Mixed hyperlipidemia    Moderate mitral regurgitation    OSA  (obstructive sleep apnea)    Right lower lobe pulmonary nodule    a. 04/2021 CTA Chest: 13x10 mm nodular density noted laterally in the right lower lobe-recommend repeat chest CT, PET/CT, or tissue sampling in 3 months.   Tobacco abuse     ASSESSMENT 56 year old male who presents to the HF clinic for follow up. PMH relevant to HFmrEF, alcohol abuse, bladder cancer, HTN, HLD, moderate mitral regurgitation, tobacco abuse. Taking blood pressure daily. Typically Bps in the 130-150s. Has been experiencing orthostasis, with one severe episode occurring overnight. Is uninsured. Actively working on submitting [facility] application.  Recent ED Visit (past 6 months): Date - 07/12/21  PLAN Suggested medication management as a solution for pharmacy services.  Deferred titration of GDMT given orthostatic episodes.   Time spent: 15 minutes  Wynelle Cleveland, PharmD Pharmacy Resident  08/05/2021 2:51 PM     Current Outpatient Medications:    albuterol (VENTOLIN HFA) 108 (90 Base) MCG/ACT inhaler, Inhale 2 puffs into the lungs every 6 (six) hours as needed for wheezing or shortness of breath. (Patient not  taking: Reported on 08/05/2021), Disp: 8 g, Rfl: 2   aspirin EC 81 MG EC tablet, Take 1 tablet (81 mg total) by mouth daily. Swallow whole., Disp: 30 tablet, Rfl: 0   atorvastatin (LIPITOR) 40 MG tablet, Take 1 tablet (40 mg total) by mouth daily., Disp: 30 tablet, Rfl: 5   carvedilol (COREG) 25 MG tablet, Take 1 tablet (25 mg total) by mouth 2 (two) times daily., Disp: 60 tablet, Rfl: 5   empagliflozin (JARDIANCE) 10 MG TABS tablet, Take 1 tablet (10 mg total) by mouth daily before breakfast. (Patient not taking: Reported on 08/05/2021), Disp: 30 tablet, Rfl: 5   ipratropium (ATROVENT HFA) 17 MCG/ACT inhaler, Inhale 1 puff into the lungs 3 (three) times daily. (Patient not taking: Reported on 05/21/2021), Disp: 1 each, Rfl: 2   losartan (COZAAR) 100 MG tablet, Take 100 mg by mouth daily., Disp: ,  Rfl:    spironolactone (ALDACTONE) 25 MG tablet, Take 25 mg by mouth daily., Disp: , Rfl:    COUNSELING POINTS/CLINICAL PEARLS   DRUGS TO CAUTION IN HEART FAILURE  Drug or Class Mechanism  Analgesics NSAIDs COX-2 inhibitors Glucocorticoids  Sodium and water retention, increased systemic vascular resistance, decreased response to diuretics   Diabetes Medications Metformin Thiazolidinediones Rosiglitazone (Avandia) Pioglitazone (Actos) DPP4 Inhibitors Saxagliptin (Onglyza) Sitagliptin (Januvia)   Lactic acidosis Possible calcium channel blockade   Unknown  Antiarrhythmics Class I  Flecainide Disopyramide Class III Sotalol Other Dronedarone  Negative inotrope, proarrhythmic   Proarrhythmic, beta blockade  Negative inotrope  Antihypertensives Alpha Blockers Doxazosin Calcium Channel Blockers Diltiazem Verapamil Nifedipine Central Alpha Adrenergics Moxonidine Peripheral Vasodilators Minoxidil  Increases renin and aldosterone  Negative inotrope    Possible sympathetic withdrawal  Unknown  Anti-infective Itraconazole Amphotericin B  Negative inotrope Unknown  Hematologic Anagrelide Cilostazol   Possible inhibition of PD IV Inhibition of PD III causing arrhythmias  Neurologic/Psychiatric Stimulants Anti-Seizure Drugs Carbamazepine Pregabalin Antidepressants Tricyclics Citalopram Parkinsons Bromocriptine Pergolide Pramipexole Antipsychotics Clozapine Antimigraine Ergotamine Methysergide Appetite suppressants Bipolar Lithium  Peripheral alpha and beta agonist activity  Negative inotrope and chronotrope Calcium channel blockade  Negative inotrope, proarrhythmic Dose-dependent QT prolongation  Excessive serotonin activity/valvular damage Excessive serotonin activity/valvular damage Unknown  IgE mediated hypersensitivy, calcium channel blockade  Excessive serotonin activity/valvular damage Excessive serotonin  activity/valvular damage Valvular damage  Direct myofibrillar degeneration, adrenergic stimulation  Antimalarials Chloroquine Hydroxychloroquine Intracellular inhibition of lysosomal enzymes  Urologic Agents Alpha Blockers Doxazosin Prazosin Tamsulosin Terazosin  Increased renin and aldosterone  Adapted from Page Carleene Overlie, et al. "Drugs That May Cause or Exacerbate Heart Failure: A Scientific Statement from the American Heart  Association." Circulation 2016; 134:e32-e69. DOI: 10.1161/CIR.0000000000000426   MEDICATION ADHERENCES TIPS AND STRATEGIES Taking medication as prescribed improves patient outcomes in heart failure (reduces hospitalizations, improves symptoms, increases survival) Side effects of medications can be managed by decreasing doses, switching agents, stopping drugs, or adding additional therapy. Please let someone in the [facility] know if you have having bothersome side effects so we can modify your regimen. Do not alter your medication regimen without talking to [place].  Medication reminders can help patients remember to take drugs on time. If you are missing or forgetting doses you can try linking behaviors, using pill boxes, or an electronic reminder like an alarm on your phone or an app. Some people can also get automated phone calls as medication reminders.

## 2021-08-08 ENCOUNTER — Ambulatory Visit: Payer: Medicaid Other | Admitting: Internal Medicine

## 2021-08-08 ENCOUNTER — Other Ambulatory Visit: Payer: Self-pay

## 2021-08-08 MED ORDER — CARVEDILOL 25 MG PO TABS
ORAL_TABLET | ORAL | 5 refills | Status: DC
  Filled 2021-08-08: qty 60, 30d supply, fill #0

## 2021-08-08 MED ORDER — SPIRONOLACTONE 25 MG PO TABS
ORAL_TABLET | ORAL | 2 refills | Status: DC
  Filled 2021-08-08: qty 30, 30d supply, fill #0
  Filled 2021-09-24: qty 30, 30d supply, fill #1
  Filled 2021-10-20: qty 30, 30d supply, fill #2

## 2021-08-08 MED ORDER — LOSARTAN POTASSIUM 100 MG PO TABS
ORAL_TABLET | ORAL | 5 refills | Status: DC
  Filled 2021-08-08: qty 30, 30d supply, fill #0
  Filled 2021-09-24: qty 30, 30d supply, fill #1
  Filled 2021-10-20 – 2021-10-21 (×2): qty 30, 30d supply, fill #2
  Filled 2021-12-02: qty 30, 30d supply, fill #3
  Filled 2021-12-16: qty 30, 30d supply, fill #4
  Filled 2021-12-29: qty 30, 30d supply, fill #5
  Filled ????-??-?? (×2): fill #3

## 2021-08-09 ENCOUNTER — Other Ambulatory Visit: Payer: Self-pay

## 2021-08-09 ENCOUNTER — Other Ambulatory Visit: Payer: Self-pay | Admitting: Family

## 2021-08-09 MED ORDER — ALBUTEROL SULFATE HFA 108 (90 BASE) MCG/ACT IN AERS
2.0000 | INHALATION_SPRAY | Freq: Four times a day (QID) | RESPIRATORY_TRACT | 0 refills | Status: DC | PRN
  Filled 2021-08-09: qty 8, fill #0
  Filled 2021-08-09: qty 6.7, 25d supply, fill #0
  Filled 2021-10-21 – 2022-01-27 (×3): qty 6.7, 25d supply, fill #1

## 2021-08-12 ENCOUNTER — Other Ambulatory Visit: Payer: Self-pay

## 2021-08-13 ENCOUNTER — Encounter: Payer: Self-pay | Admitting: Pharmacy Technician

## 2021-08-13 ENCOUNTER — Other Ambulatory Visit: Payer: Self-pay

## 2021-08-13 NOTE — Patient Outreach (Signed)
Patient only signed DOH Attestation.  Would need to provide current year's household income if PAP medications were needed. ? ?Nohea Kras J. Haruye Lainez ?Patient Advocate Specialist ?[facility]  ?

## 2021-08-21 ENCOUNTER — Ambulatory Visit (INDEPENDENT_AMBULATORY_CARE_PROVIDER_SITE_OTHER): Payer: Medicaid Other | Admitting: Nurse Practitioner

## 2021-08-21 ENCOUNTER — Encounter: Payer: Self-pay | Admitting: Nurse Practitioner

## 2021-08-21 VITALS — BP 120/76 | HR 59 | Ht 72.0 in | Wt 232.0 lb

## 2021-08-21 DIAGNOSIS — I5022 Chronic systolic (congestive) heart failure: Secondary | ICD-10-CM | POA: Diagnosis not present

## 2021-08-21 DIAGNOSIS — R911 Solitary pulmonary nodule: Secondary | ICD-10-CM

## 2021-08-21 DIAGNOSIS — E782 Mixed hyperlipidemia: Secondary | ICD-10-CM | POA: Diagnosis not present

## 2021-08-21 DIAGNOSIS — G4733 Obstructive sleep apnea (adult) (pediatric): Secondary | ICD-10-CM

## 2021-08-21 DIAGNOSIS — I429 Cardiomyopathy, unspecified: Secondary | ICD-10-CM

## 2021-08-21 DIAGNOSIS — I10 Essential (primary) hypertension: Secondary | ICD-10-CM

## 2021-08-21 DIAGNOSIS — Z720 Tobacco use: Secondary | ICD-10-CM

## 2021-08-21 DIAGNOSIS — F1010 Alcohol abuse, uncomplicated: Secondary | ICD-10-CM

## 2021-08-21 NOTE — Progress Notes (Signed)
Office Visit    Patient Name: Nicholas Chung Date of Encounter: 08/21/2021  Primary Care Provider:  Pcp, No Primary Cardiologist:  Kathlyn Sacramento, MD  Chief Complaint    56 y/o ? with a history of hypertension, hyperlipidemia, bladder cancer, tobacco and alcohol use, COPD, and cardiomyopathy, who presents for follow-up of cardiomyopathy and heart failure with midrange ejection fraction.  Past Medical History    Past Medical History:  Diagnosis Date   Alcohol abuse    Bladder cancer ([place])    Cardiomyopathy ([place])    a. 04/2021 Echo: EF 40-45%, mild glob HK, sev apical HK, GrII DD.  Pt declined ischemic w/u.   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 04/2021 Echo: EF 40-45%, mild glob HK, sev apical HK, GrII DD, nl RV fxn, mildly dil LA, mod MR.   Hypertension    Mixed hyperlipidemia    Moderate mitral regurgitation    OSA (obstructive sleep apnea)    Right lower lobe pulmonary nodule    a. 04/2021 CTA Chest: 13x10 mm nodular density noted laterally in the right lower lobe-recommend repeat chest CT, PET/CT, or tissue sampling in 3 months.   Tobacco abuse    Past Surgical History:  Procedure Laterality Date   REPLACEMENT TOTAL KNEE Left     Allergies  No Known Allergies  History of Present Illness    57 year old male with above past medical history including hypertension, hyperlipidemia, dilated cardiomyopathy, heart failure with midrange ejection fraction, moderate mitral regurgitation, sleep apnea, right lower lobe pulmonary nodule, bladder cancer, tobacco use, alcohol use.  In February 2023, he was admitted to [facility] with intermittent chest tightness and dyspnea.  Troponin was elevated at 61.  He was treated for acute respiratory failure with steroids and nebulizers.  Echo showed an EF of 40 to 45% with severe apical hypokinesis and grade 2 diastolic dysfunction.  Moderate mitral regurgitation was also noted.  Initially,  recommendation was made for right and left heart cardiac catheterization however, he declined and instead, was discharged home on medical therapy with recommendation for close outpatient follow-up and consideration of testing at some point in the future.  Of note, CT of the chest during admission was negative for PE but did show a 13 x 10 mm nodular density noted laterally in the right lower lobe with recommendation for follow-up CT, PET/CT, or tissue sampling in 3 months.  He was last seen in cardiology clinic in March, at which time he noted ongoing dyspnea on exertion with minimal activity.  He was still smoking 3 cigarettes a day, and still drinking 40 ounces of liquor about once or twice a week.  Given lack of insurance, we held off on pursuing ischemic evaluation.  He was seen June 5 in heart failure clinic, at which time he noted ongoing dyspnea.  Weight was down 5 pounds.  Labs were stable May 12.  Over the past month, Nicholas Chung has continued to do well.  He is smoking about a half a pack a day and drinking 40 ounces of liquor (scotch) on Saturdays, down from 7 days a week.  He is now working full-time and is much more active.  He only notes dyspnea on exertion when walking upstairs but otherwise, has been feeling well.  He denies chest pain, palpitations, PND, orthopnea, dizziness, syncope, edema, or early satiety.  Home Medications    Current Outpatient Medications  Medication Sig Dispense Refill   albuterol (  VENTOLIN HFA) 108 (90 Base) MCG/ACT inhaler Inhale 2 puffs into the lungs every 6 (six) hours as needed for wheezing or shortness of breath. Further refills come from PCP 8 g 0   aspirin EC 81 MG tablet Take 1 tablet (81 mg total) by mouth daily. Swallow whole. 30 tablet 0   atorvastatin (LIPITOR) 40 MG tablet Take 1 tablet (40 mg total) by mouth daily. 30 tablet 5   carvedilol (COREG) 25 MG tablet Take 1 tablet (25 mg total) by mouth 2 (two) times daily. 60 tablet 5   empagliflozin  (JARDIANCE) 10 MG TABS tablet Take 1 tablet (10 mg total) by mouth daily before breakfast. 30 tablet 5   ipratropium (ATROVENT HFA) 17 MCG/ACT inhaler Inhale 1 puff into the lungs 3 (three) times daily. 12.9 g 2   losartan (COZAAR) 100 MG tablet Take 1 tablet by mouth once daily 30 tablet 5   spironolactone (ALDACTONE) 25 MG tablet Take one tablet by mouth once daily 30 tablet 2   No current facility-administered medications for this visit.     Review of Systems    Some dyspnea when walking up stairs but otherwise denies chest pain, palpitations, PND, orthopnea, dizziness, syncope, edema, or early satiety.  All other systems reviewed and are otherwise negative except as noted above.    Physical Exam    VS:  BP 120/76 (BP Location: Left Arm, Patient Position: Sitting, Cuff Size: Normal)   Pulse (!) 59   Ht 6' (1.829 m)   Wt 232 lb (105.2 kg)   SpO2 96%   BMI 31.46 kg/m  , BMI Body mass index is 31.46 kg/m.     GEN: Well nourished, well developed, in no acute distress. HEENT: normal. Neck: Supple, no JVD, carotid bruits, or masses. Cardiac: RRR, no murmurs, rubs, or gallops. No clubbing, cyanosis, edema.  Radials/PT 2+ and equal bilaterally.  Respiratory:  Respirations regular and unlabored, clear to auscultation bilaterally. GI: Soft, nontender, nondistended, BS + x 4. MS: no deformity or atrophy. Skin: warm and dry, no rash. Neuro:  Strength and sensation are intact. Psych: Normal affect.  Accessory Clinical Findings    ECG personally reviewed by me today -sinus bradycardia, 59, lateral T wave inversion- no acute changes.  Lab Results  Component Value Date   WBC 10.0 07/12/2021   HGB 15.1 07/12/2021   HCT 45.0 07/12/2021   MCV 90.2 07/12/2021   PLT 285 07/12/2021   Lab Results  Component Value Date   CREATININE 1.19 07/12/2021   BUN 26 (H) 07/12/2021   NA 143 07/12/2021   K 3.7 07/12/2021   CL 111 07/12/2021   CO2 22 07/12/2021   Lab Results  Component Value  Date   ALT 25 04/21/2021   AST 22 04/21/2021   ALKPHOS 75 04/21/2021   BILITOT 0.5 04/21/2021   Lab Results  Component Value Date   CHOL 216 (H) 04/21/2021   HDL 46 04/21/2021   LDLCALC 148 (H) 04/21/2021   TRIG 110 04/21/2021   CHOLHDL 4.7 04/21/2021    Lab Results  Component Value Date   HGBA1C 5.6 04/20/2021    Assessment & Plan    1.  Chronic heart failure with midrange ejection fraction/dilated cardiomyopathy: Admitted with respiratory failure new finding of cardiomyopathy with an EF of 40 to 45% by echo in February 2023.  He declined right and left heart cardiac catheterization at that time and ischemic evaluation has since been deferred in the setting of lack of insurance  and financial struggles at home.  Fortunately, he has been doing well without significant symptoms of notations, and now working full-time, and tolerating activity well.  He is euvolemic on examination, and his weight has been trending down over the past few months.  He had stable lab work in May.  He remains on carvedilol, losartan, and spironolactone, and recently started taking Jardiance.  We discussed the need for an ischemic evaluation at some point however, he continues to wish to defer until he has insurance, which he is working on.  2.  Essential hypertension: Blood pressure is normal at 120/76 on beta-blocker, ARB, and spironolactone therapy.    3.  Hyperlipidemia: LDL 148 in February, at which time he was statin nave.  He is now on atorvastatin.  He does need follow-up lipids and LFTs but would like to defer for the sake of cost.  4.  Tobacco and alcohol use: Still smoking half a pack a day and drinking 40 ounces of liquor once a week.  Complete cessation advised.  5.  Moderate mitral regurgitation: Noted on echocardiogram in February.  Will need follow-up within the next few.  6.  Right lower lobe pulmonary nodule: Noted on CT during hospitalization recommendation for 8-monthfollow-up imaging.   Currently does not have insurance and as above, wishes to defer any procedures.  7.  Obstructive sleep apnea: Previously diagnosed but he does not use CPAP.  Is been many years and will likely require repeat testing.  Deferring in the setting of lack of insurance.  8.  Disposition: He has follow-up in heart failure clinic in about 2 months and we will plan to see back in about 4 to 5 months.  CMurray Hodgkins NP 08/21/2021, 4:47 PM

## 2021-08-21 NOTE — Patient Instructions (Signed)
Medication Instructions:   Your physician recommends that you continue on your current medications as directed. Please refer to the Current Medication list given to you today.  *If you need a refill on your cardiac medications before your next appointment, please call your pharmacy*   Lab Work:  None ordered  Testing/Procedures:  None ordered   Follow-Up: At [facility], you and your health needs are our priority.  As part of our continuing mission to provide you with exceptional heart care, we have created designated Provider Care Teams.  These Care Teams include your primary Cardiologist (physician) and Advanced Practice Providers (APPs -  Physician Assistants and Nurse Practitioners) who all work together to provide you with the care you need, when you need it.  We recommend signing up for the patient portal called "MyChart".  Sign up information is provided on this After Visit Summary.  MyChart is used to connect with patients for Virtual Visits (Telemedicine).  Patients are able to view lab/test results, encounter notes, upcoming appointments, etc.  Non-urgent messages can be sent to your provider as well.   To learn more about what you can do with MyChart, go to [email or web address].    Your next appointment:   4 - 5 month(s)  The format for your next appointment:   In Person  Provider:   You may see Kathlyn Sacramento, MD or one of the following Advanced Practice Providers on your designated Care Team:   Murray Hodgkins, NP   Other Instructions  Call [facility]) to assist with finding primary care provider: [phone]  Important Information About [place]

## 2021-08-24 ENCOUNTER — Emergency Department (HOSPITAL_COMMUNITY)
Admission: EM | Admit: 2021-08-24 | Discharge: 2021-08-24 | Disposition: A | Discharge status: Home / Self Care | Payer: Self-pay | Attending: Emergency Medicine | Admitting: Emergency Medicine

## 2021-08-24 ENCOUNTER — Emergency Department (HOSPITAL_COMMUNITY): Payer: Self-pay

## 2021-08-24 ENCOUNTER — Encounter (HOSPITAL_COMMUNITY): Payer: Self-pay | Admitting: *Deleted

## 2021-08-24 ENCOUNTER — Other Ambulatory Visit: Payer: Self-pay

## 2021-08-24 DIAGNOSIS — M79661 Pain in right lower leg: Secondary | ICD-10-CM | POA: Insufficient documentation

## 2021-08-24 DIAGNOSIS — R0789 Other chest pain: Secondary | ICD-10-CM | POA: Insufficient documentation

## 2021-08-24 DIAGNOSIS — S0991XA Unspecified injury of ear, initial encounter: Secondary | ICD-10-CM | POA: Diagnosis present

## 2021-08-24 DIAGNOSIS — S01311A Laceration without foreign body of right ear, initial encounter: Secondary | ICD-10-CM | POA: Diagnosis not present

## 2021-08-24 DIAGNOSIS — I110 Hypertensive heart disease with heart failure: Secondary | ICD-10-CM | POA: Diagnosis not present

## 2021-08-24 DIAGNOSIS — Z7951 Long term (current) use of inhaled steroids: Secondary | ICD-10-CM | POA: Insufficient documentation

## 2021-08-24 DIAGNOSIS — Z7982 Long term (current) use of aspirin: Secondary | ICD-10-CM | POA: Insufficient documentation

## 2021-08-24 DIAGNOSIS — Z79899 Other long term (current) drug therapy: Secondary | ICD-10-CM | POA: Diagnosis not present

## 2021-08-24 DIAGNOSIS — M542 Cervicalgia: Secondary | ICD-10-CM | POA: Insufficient documentation

## 2021-08-24 DIAGNOSIS — M79642 Pain in left hand: Secondary | ICD-10-CM | POA: Insufficient documentation

## 2021-08-24 DIAGNOSIS — Z23 Encounter for immunization: Secondary | ICD-10-CM | POA: Insufficient documentation

## 2021-08-24 DIAGNOSIS — R930 Abnormal findings on diagnostic imaging of skull and head, not elsewhere classified: Secondary | ICD-10-CM | POA: Diagnosis not present

## 2021-08-24 DIAGNOSIS — J441 Chronic obstructive pulmonary disease with (acute) exacerbation: Secondary | ICD-10-CM | POA: Diagnosis not present

## 2021-08-24 DIAGNOSIS — Y92410 Unspecified street and highway as the place of occurrence of the external cause: Secondary | ICD-10-CM | POA: Insufficient documentation

## 2021-08-24 DIAGNOSIS — I509 Heart failure, unspecified: Secondary | ICD-10-CM | POA: Insufficient documentation

## 2021-08-24 DIAGNOSIS — S301XXA Contusion of abdominal wall, initial encounter: Secondary | ICD-10-CM | POA: Diagnosis not present

## 2021-08-24 LAB — CBC WITH DIFFERENTIAL/PLATELET
Abs Immature Granulocytes: 0.03 10*3/uL (ref 0.00–0.07)
Basophils Absolute: 0 10*3/uL (ref 0.0–0.1)
Basophils Relative: 0 %
Eosinophils Absolute: 0.1 10*3/uL (ref 0.0–0.5)
Eosinophils Relative: 1 %
HCT: 41.2 % (ref 39.0–52.0)
Hemoglobin: 13.8 g/dL (ref 13.0–17.0)
Immature Granulocytes: 0 %
Lymphocytes Relative: 12 %
Lymphs Abs: 1 10*3/uL (ref 0.7–4.0)
MCH: 31.7 pg (ref 26.0–34.0)
MCHC: 33.5 g/dL (ref 30.0–36.0)
MCV: 94.7 fL (ref 80.0–100.0)
Monocytes Absolute: 1 10*3/uL (ref 0.1–1.0)
Monocytes Relative: 12 %
Neutro Abs: 6.1 10*3/uL (ref 1.7–7.7)
Neutrophils Relative %: 75 %
Platelets: 246 10*3/uL (ref 150–400)
RBC: 4.35 MIL/uL (ref 4.22–5.81)
RDW: 13.6 % (ref 11.5–15.5)
WBC: 8.2 10*3/uL (ref 4.0–10.5)
nRBC: 0 % (ref 0.0–0.2)

## 2021-08-24 LAB — BASIC METABOLIC PANEL
Anion gap: 7 (ref 5–15)
BUN: 26 mg/dL — ABNORMAL HIGH (ref 6–20)
CO2: 24 mmol/L (ref 22–32)
Calcium: 8.4 mg/dL — ABNORMAL LOW (ref 8.9–10.3)
Chloride: 106 mmol/L (ref 98–111)
Creatinine, Ser: 1.03 mg/dL (ref 0.61–1.24)
GFR, Estimated: >60 mL/min (ref >60)
Glucose, Bld: 133 mg/dL — ABNORMAL HIGH (ref 70–99)
Potassium: 3.7 mmol/L (ref 3.5–5.1)
Sodium: 137 mmol/L (ref 135–145)

## 2021-08-24 LAB — ETHANOL: Alcohol, Ethyl (B): <10 mg/dL (ref <10)

## 2021-08-24 MED ORDER — OXYCODONE-ACETAMINOPHEN 5-325 MG PO TABS
1.0000 | ORAL_TABLET | Freq: Once | ORAL | Status: AC
  Administered 2021-08-24: 1 via ORAL
  Filled 2021-08-24: qty 1

## 2021-08-24 MED ORDER — KETOROLAC TROMETHAMINE 15 MG/ML IJ SOLN
15.0000 mg | Freq: Once | INTRAMUSCULAR | Status: AC
Start: 2021-08-24 — End: 2021-08-24
  Administered 2021-08-24: 15 mg via INTRAVENOUS
  Filled 2021-08-24: qty 1

## 2021-08-24 MED ORDER — TETANUS-DIPHTH-ACELL PERTUSSIS 5-2.5-18.5 LF-MCG/0.5 IM SUSY
0.5000 mL | PREFILLED_SYRINGE | Freq: Once | INTRAMUSCULAR | Status: AC
  Administered 2021-08-24: 0.5 mL via INTRAMUSCULAR
  Filled 2021-08-24: qty 0.5

## 2021-08-24 MED ORDER — MORPHINE SULFATE (PF) 4 MG/ML IV SOLN
4.0000 mg | Freq: Once | INTRAVENOUS | Status: AC
  Administered 2021-08-24: 4 mg via INTRAVENOUS
  Filled 2021-08-24: qty 1

## 2021-08-24 MED ORDER — LIDOCAINE HCL (PF) 2 % IJ SOLN
INTRAMUSCULAR | Status: DC
Start: 2021-08-24 — End: 2021-08-24
  Filled 2021-08-24: qty 10

## 2021-08-24 MED ORDER — LIDOCAINE HCL (PF) 1 % IJ SOLN
5.0000 mL | Freq: Once | INTRAMUSCULAR | Status: AC
  Administered 2021-08-24: 5 mL
  Filled 2021-08-24: qty 5

## 2021-08-24 MED ORDER — OXYCODONE-ACETAMINOPHEN 5-325 MG PO TABS: 1.0000 | ORAL_TABLET | Freq: Four times a day (QID) | ORAL | 0 refills | Status: DC | PRN

## 2021-08-24 MED ORDER — IOHEXOL 300 MG/ML  SOLN
100.0000 mL | Freq: Once | INTRAMUSCULAR | Status: AC | PRN
  Administered 2021-08-24: 100 mL via INTRAVENOUS

## 2021-08-24 MED ORDER — ONDANSETRON HCL 4 MG/2ML IJ SOLN
4.0000 mg | Freq: Once | INTRAMUSCULAR | Status: AC
  Administered 2021-08-24: 4 mg via INTRAVENOUS
  Filled 2021-08-24: qty 2

## 2021-08-26 ENCOUNTER — Other Ambulatory Visit: Payer: Self-pay

## 2021-08-26 ENCOUNTER — Encounter (HOSPITAL_COMMUNITY): Payer: Self-pay

## 2021-08-26 ENCOUNTER — Emergency Department (HOSPITAL_COMMUNITY)
Admission: EM | Admit: 2021-08-26 | Discharge: 2021-08-26 | Disposition: A | Discharge status: Home / Self Care | Payer: Medicaid Other | Attending: Emergency Medicine | Admitting: Emergency Medicine

## 2021-08-26 DIAGNOSIS — Z7982 Long term (current) use of aspirin: Secondary | ICD-10-CM | POA: Diagnosis not present

## 2021-08-26 DIAGNOSIS — Z5189 Encounter for other specified aftercare: Secondary | ICD-10-CM

## 2021-08-26 DIAGNOSIS — Z4800 Encounter for change or removal of nonsurgical wound dressing: Secondary | ICD-10-CM | POA: Insufficient documentation

## 2021-08-26 MED ORDER — OXYCODONE-ACETAMINOPHEN 5-325 MG PO TABS
1.0000 | ORAL_TABLET | Freq: Once | ORAL | Status: AC
  Administered 2021-08-26: 1 via ORAL
  Filled 2021-08-26: qty 1

## 2021-08-26 MED ORDER — OXYCODONE-ACETAMINOPHEN 5-325 MG PO TABS: 1.0000 | ORAL_TABLET | Freq: Four times a day (QID) | ORAL | 0 refills | Status: DC | PRN
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
## 2021-08-27 ENCOUNTER — Ambulatory Visit: Payer: Self-pay

## 2021-08-29 ENCOUNTER — Ambulatory Visit: Payer: Self-pay | Admitting: Family Medicine

## 2021-08-30 ENCOUNTER — Ambulatory Visit: Payer: Medicaid Other | Admitting: Family

## 2021-08-30 ENCOUNTER — Ambulatory Visit: Payer: Self-pay | Admitting: Family

## 2021-08-30 ENCOUNTER — Telehealth: Payer: Self-pay

## 2021-08-30 NOTE — Telephone Encounter (Signed)
Patient sch a mychart new patient apt for 6/30 at 920am - patient no showed and spouse called wanting to rs as they overslept. Marland Kitchen Next avl apt for a new patient is for 7/12. Patients wife proceed to use inappropriate language. Patients wife proceeded to sch new aptient apt for 7/12- patients wife was informed of no show cancellation fee and  office policies, stated she haves the whole month to cancel and will look for another appointment elsewhere.

## 2021-09-06 ENCOUNTER — Ambulatory Visit (INDEPENDENT_AMBULATORY_CARE_PROVIDER_SITE_OTHER): Payer: Self-pay | Admitting: Nurse Practitioner

## 2021-09-06 ENCOUNTER — Encounter: Payer: Self-pay | Admitting: Nurse Practitioner

## 2021-09-06 VITALS — BP 137/85 | Wt 227.0 lb

## 2021-09-06 DIAGNOSIS — I5042 Chronic combined systolic (congestive) and diastolic (congestive) heart failure: Secondary | ICD-10-CM

## 2021-09-06 NOTE — Patient Instructions (Signed)
1. Chronic combined systolic and diastolic CHF (congestive heart failure) ([place])   Will send medication refill requests to pharmacist  Stay active  Heart healthy diet  Follow up in 3 months
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
## 2021-09-11 ENCOUNTER — Ambulatory Visit: Payer: Self-pay | Admitting: Family

## 2021-09-13 ENCOUNTER — Institutional Professional Consult (permissible substitution): Payer: Medicaid Other | Admitting: Plastic Surgery

## 2021-09-24 ENCOUNTER — Other Ambulatory Visit: Payer: Self-pay

## 2021-09-25 ENCOUNTER — Other Ambulatory Visit: Payer: Self-pay

## 2021-10-14 ENCOUNTER — Ambulatory Visit: Payer: 59 | Attending: Family | Admitting: Family

## 2021-10-14 ENCOUNTER — Encounter: Payer: Self-pay | Admitting: Family

## 2021-10-14 VITALS — BP 148/90 | HR 63 | Resp 14 | Ht 72.0 in | Wt 230.0 lb

## 2021-10-14 DIAGNOSIS — I110 Hypertensive heart disease with heart failure: Secondary | ICD-10-CM | POA: Diagnosis not present

## 2021-10-14 DIAGNOSIS — F17210 Nicotine dependence, cigarettes, uncomplicated: Secondary | ICD-10-CM | POA: Diagnosis not present

## 2021-10-14 DIAGNOSIS — I5022 Chronic systolic (congestive) heart failure: Secondary | ICD-10-CM | POA: Diagnosis not present

## 2021-10-14 DIAGNOSIS — F1010 Alcohol abuse, uncomplicated: Secondary | ICD-10-CM

## 2021-10-14 DIAGNOSIS — J449 Chronic obstructive pulmonary disease, unspecified: Secondary | ICD-10-CM | POA: Insufficient documentation

## 2021-10-14 DIAGNOSIS — Z79899 Other long term (current) drug therapy: Secondary | ICD-10-CM | POA: Diagnosis not present

## 2021-10-14 DIAGNOSIS — Z720 Tobacco use: Secondary | ICD-10-CM | POA: Diagnosis not present

## 2021-10-14 DIAGNOSIS — I10 Essential (primary) hypertension: Secondary | ICD-10-CM | POA: Diagnosis not present

## 2021-10-14 DIAGNOSIS — R69 Illness, unspecified: Secondary | ICD-10-CM | POA: Diagnosis not present

## 2021-10-14 MED ORDER — EMPAGLIFLOZIN 10 MG PO TABS
10.0000 mg | ORAL_TABLET | Freq: Every day | ORAL | 5 refills | Status: DC
Start: 2021-10-14 — End: 2021-12-17
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
## 2021-10-14 NOTE — Patient Instructions (Signed)
Continue weighing daily and call for an overnight weight gain of 3 pounds or more or a weekly weight gain of more than 5 pounds.  °

## 2021-10-18 ENCOUNTER — Emergency Department (HOSPITAL_COMMUNITY): Payer: 59

## 2021-10-18 ENCOUNTER — Emergency Department (HOSPITAL_COMMUNITY)
Admission: EM | Admit: 2021-10-18 | Discharge: 2021-10-18 | Disposition: A | Discharge status: Home / Self Care | Payer: 59 | Attending: Emergency Medicine | Admitting: Emergency Medicine

## 2021-10-18 DIAGNOSIS — I5042 Chronic combined systolic (congestive) and diastolic (congestive) heart failure: Secondary | ICD-10-CM | POA: Insufficient documentation

## 2021-10-18 DIAGNOSIS — Z96652 Presence of left artificial knee joint: Secondary | ICD-10-CM | POA: Diagnosis not present

## 2021-10-18 DIAGNOSIS — I110 Hypertensive heart disease with heart failure: Secondary | ICD-10-CM | POA: Insufficient documentation

## 2021-10-18 DIAGNOSIS — R410 Disorientation, unspecified: Secondary | ICD-10-CM | POA: Diagnosis not present

## 2021-10-18 DIAGNOSIS — J449 Chronic obstructive pulmonary disease, unspecified: Secondary | ICD-10-CM | POA: Diagnosis not present

## 2021-10-18 DIAGNOSIS — Z7982 Long term (current) use of aspirin: Secondary | ICD-10-CM | POA: Diagnosis not present

## 2021-10-18 DIAGNOSIS — S71131A Puncture wound without foreign body, right thigh, initial encounter: Secondary | ICD-10-CM | POA: Insufficient documentation

## 2021-10-18 DIAGNOSIS — Z79899 Other long term (current) drug therapy: Secondary | ICD-10-CM | POA: Insufficient documentation

## 2021-10-18 DIAGNOSIS — S8991XA Unspecified injury of right lower leg, initial encounter: Secondary | ICD-10-CM | POA: Diagnosis not present

## 2021-10-18 DIAGNOSIS — Z8551 Personal history of malignant neoplasm of bladder: Secondary | ICD-10-CM | POA: Insufficient documentation

## 2021-10-18 DIAGNOSIS — F17210 Nicotine dependence, cigarettes, uncomplicated: Secondary | ICD-10-CM | POA: Diagnosis not present

## 2021-10-18 DIAGNOSIS — W3400XA Accidental discharge from unspecified firearms or gun, initial encounter: Secondary | ICD-10-CM | POA: Insufficient documentation

## 2021-10-18 DIAGNOSIS — S81801A Unspecified open wound, right lower leg, initial encounter: Secondary | ICD-10-CM | POA: Diagnosis not present

## 2021-10-18 DIAGNOSIS — T07XXXA Unspecified multiple injuries, initial encounter: Secondary | ICD-10-CM | POA: Diagnosis not present

## 2021-10-18 LAB — I-STAT CHEM 8, ED
BUN: 23 mg/dL — ABNORMAL HIGH (ref 6–20)
Calcium, Ion: 1.11 mmol/L — ABNORMAL LOW (ref 1.15–1.40)
Chloride: 109 mmol/L (ref 98–111)
Creatinine, Ser: 1.3 mg/dL — ABNORMAL HIGH (ref 0.61–1.24)
Glucose, Bld: 101 mg/dL — ABNORMAL HIGH (ref 70–99)
HCT: 49 % (ref 39.0–52.0)
Hemoglobin: 16.7 g/dL (ref 13.0–17.0)
Potassium: 4 mmol/L (ref 3.5–5.1)
Sodium: 143 mmol/L (ref 135–145)
TCO2: 20 mmol/L — ABNORMAL LOW (ref 22–32)

## 2021-10-18 LAB — LACTIC ACID, PLASMA: Lactic Acid, Venous: 2.3 mmol/L (ref 0.5–1.9)

## 2021-10-18 LAB — COMPREHENSIVE METABOLIC PANEL
ALT: 24 U/L (ref 0–44)
AST: 27 U/L (ref 15–41)
Albumin: 4.2 g/dL (ref 3.5–5.0)
Alkaline Phosphatase: 101 U/L (ref 38–126)
Anion gap: 9 (ref 5–15)
BUN: 20 mg/dL (ref 6–20)
CO2: 22 mmol/L (ref 22–32)
Calcium: 9.2 mg/dL (ref 8.9–10.3)
Chloride: 111 mmol/L (ref 98–111)
Creatinine, Ser: 1.16 mg/dL (ref 0.61–1.24)
GFR, Estimated: >60 mL/min (ref >60)
Glucose, Bld: 104 mg/dL — ABNORMAL HIGH (ref 70–99)
Potassium: 4 mmol/L (ref 3.5–5.1)
Sodium: 142 mmol/L (ref 135–145)
Total Bilirubin: 0.4 mg/dL (ref 0.3–1.2)
Total Protein: 7.5 g/dL (ref 6.5–8.1)

## 2021-10-18 LAB — SAMPLE TO BLOOD BANK

## 2021-10-18 LAB — ETHANOL: Alcohol, Ethyl (B): 241 mg/dL — ABNORMAL HIGH (ref <10)

## 2021-10-18 LAB — PROTIME-INR
INR: 1 (ref 0.8–1.2)
Prothrombin Time: 13.4 seconds (ref 11.4–15.2)

## 2021-10-18 LAB — CBC
HCT: 48.9 % (ref 39.0–52.0)
Hemoglobin: 16.2 g/dL (ref 13.0–17.0)
MCH: 32.1 pg (ref 26.0–34.0)
MCHC: 33.1 g/dL (ref 30.0–36.0)
MCV: 96.8 fL (ref 80.0–100.0)
Platelets: 316 10*3/uL (ref 150–400)
RBC: 5.05 MIL/uL (ref 4.22–5.81)
RDW: 12.2 % (ref 11.5–15.5)
WBC: 10.3 10*3/uL (ref 4.0–10.5)
nRBC: 0 % (ref 0.0–0.2)

## 2021-10-18 MED ORDER — HYDROCODONE-ACETAMINOPHEN 5-325 MG PO TABS: 1.0000 | ORAL_TABLET | Freq: Four times a day (QID) | ORAL | 0 refills | Status: DC | PRN

## 2021-10-18 MED ORDER — TETANUS-DIPHTH-ACELL PERTUSSIS 5-2.5-18.5 LF-MCG/0.5 IM SUSY: 0.5000 mL | PREFILLED_SYRINGE | Freq: Once | INTRAMUSCULAR | Status: DC

## 2021-10-18 MED ORDER — IOHEXOL 350 MG/ML SOLN
100.0000 mL | Freq: Once | INTRAVENOUS | Status: AC | PRN
  Administered 2021-10-18: 100 mL via INTRAVENOUS
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
## 2021-10-18 NOTE — ED Notes (Signed)
Pt wound rinsed, dressed w gauze, krilex, & ace bandage
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
## 2021-10-18 NOTE — ED Notes (Signed)
Patient verbalizes understanding of discharge instructions. Opportunity for questioning and answers were provided. Pt discharged w wife stable & ambulatory.
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
## 2021-10-18 NOTE — ED Notes (Signed)
Leg re-wrapped  with bandage. Wife at bedside. Pt ambulatory to lobby with discharge paperwork
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
## 2021-10-20 ENCOUNTER — Other Ambulatory Visit: Payer: Self-pay

## 2021-10-21 ENCOUNTER — Other Ambulatory Visit: Payer: Self-pay

## 2021-10-22 ENCOUNTER — Encounter (HOSPITAL_COMMUNITY): Payer: Self-pay

## 2021-10-22 ENCOUNTER — Other Ambulatory Visit: Payer: Self-pay

## 2021-10-22 ENCOUNTER — Other Ambulatory Visit: Payer: Self-pay | Admitting: Family

## 2021-10-22 ENCOUNTER — Emergency Department (HOSPITAL_COMMUNITY): Payer: 59

## 2021-10-22 ENCOUNTER — Emergency Department (HOSPITAL_COMMUNITY)
Admission: EM | Admit: 2021-10-22 | Discharge: 2021-10-22 | Disposition: A | Discharge status: Home / Self Care | Payer: 59 | Attending: Emergency Medicine | Admitting: Emergency Medicine

## 2021-10-22 DIAGNOSIS — M7989 Other specified soft tissue disorders: Secondary | ICD-10-CM | POA: Diagnosis not present

## 2021-10-22 DIAGNOSIS — Z79899 Other long term (current) drug therapy: Secondary | ICD-10-CM | POA: Diagnosis not present

## 2021-10-22 DIAGNOSIS — I10 Essential (primary) hypertension: Secondary | ICD-10-CM | POA: Insufficient documentation

## 2021-10-22 DIAGNOSIS — Z4800 Encounter for change or removal of nonsurgical wound dressing: Secondary | ICD-10-CM | POA: Diagnosis not present

## 2021-10-22 DIAGNOSIS — Z5189 Encounter for other specified aftercare: Secondary | ICD-10-CM

## 2021-10-22 DIAGNOSIS — Z7982 Long term (current) use of aspirin: Secondary | ICD-10-CM | POA: Insufficient documentation

## 2021-10-22 DIAGNOSIS — M1711 Unilateral primary osteoarthritis, right knee: Secondary | ICD-10-CM | POA: Diagnosis not present

## 2021-10-22 DIAGNOSIS — Z4801 Encounter for change or removal of surgical wound dressing: Secondary | ICD-10-CM | POA: Diagnosis not present

## 2021-10-22 DIAGNOSIS — R600 Localized edema: Secondary | ICD-10-CM | POA: Diagnosis not present

## 2021-10-22 LAB — BASIC METABOLIC PANEL
Anion gap: 9 (ref 5–15)
BUN: 23 mg/dL — ABNORMAL HIGH (ref 6–20)
CO2: 27 mmol/L (ref 22–32)
Calcium: 10.1 mg/dL (ref 8.9–10.3)
Chloride: 104 mmol/L (ref 98–111)
Creatinine, Ser: 0.93 mg/dL (ref 0.61–1.24)
GFR, Estimated: >60 mL/min (ref >60)
Glucose, Bld: 99 mg/dL (ref 70–99)
Potassium: 4.5 mmol/L (ref 3.5–5.1)
Sodium: 140 mmol/L (ref 135–145)

## 2021-10-22 LAB — CBC WITH DIFFERENTIAL/PLATELET
Abs Immature Granulocytes: 0.03 10*3/uL (ref 0.00–0.07)
Basophils Absolute: 0 10*3/uL (ref 0.0–0.1)
Basophils Relative: 1 %
Eosinophils Absolute: 0.1 10*3/uL (ref 0.0–0.5)
Eosinophils Relative: 1 %
HCT: 47.5 % (ref 39.0–52.0)
Hemoglobin: 15.8 g/dL (ref 13.0–17.0)
Immature Granulocytes: 0 %
Lymphocytes Relative: 28 %
Lymphs Abs: 2.2 10*3/uL (ref 0.7–4.0)
MCH: 32.1 pg (ref 26.0–34.0)
MCHC: 33.3 g/dL (ref 30.0–36.0)
MCV: 96.5 fL (ref 80.0–100.0)
Monocytes Absolute: 0.6 10*3/uL (ref 0.1–1.0)
Monocytes Relative: 8 %
Neutro Abs: 4.9 10*3/uL (ref 1.7–7.7)
Neutrophils Relative %: 62 %
Platelets: 302 10*3/uL (ref 150–400)
RBC: 4.92 MIL/uL (ref 4.22–5.81)
RDW: 12.1 % (ref 11.5–15.5)
WBC: 7.8 10*3/uL (ref 4.0–10.5)
nRBC: 0 % (ref 0.0–0.2)

## 2021-10-22 MED ORDER — IOHEXOL 300 MG/ML  SOLN
75.0000 mL | Freq: Once | INTRAMUSCULAR | Status: AC | PRN
  Administered 2021-10-22: 75 mL via INTRAVENOUS

## 2021-10-22 MED ORDER — CEPHALEXIN 500 MG PO CAPS: 500.0000 mg | ORAL_CAPSULE | Freq: Four times a day (QID) | ORAL | 0 refills | Status: DC

## 2021-10-22 MED ORDER — HYDROCODONE-ACETAMINOPHEN 5-325 MG PO TABS: ORAL_TABLET | ORAL | 0 refills | Status: DC

## 2021-10-22 MED ORDER — SULFAMETHOXAZOLE-TRIMETHOPRIM 800-160 MG PO TABS: 1.0000 | ORAL_TABLET | Freq: Two times a day (BID) | ORAL | 0 refills | Status: AC

## 2021-10-22 MED ORDER — OXYCODONE-ACETAMINOPHEN 5-325 MG PO TABS
1.0000 | ORAL_TABLET | Freq: Once | ORAL | Status: AC
  Administered 2021-10-22: 1 via ORAL
  Filled 2021-10-22: qty 1

## 2021-10-22 NOTE — Discharge Instructions (Addendum)
Elevate your leg when possible.  Take the antibiotics as directed until finished.  Clean the areas with mild soap and water.  Follow-up with your primary care provider for recheck.
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
## 2021-10-23 ENCOUNTER — Other Ambulatory Visit: Payer: Self-pay

## 2021-10-23 MED ORDER — EMPAGLIFLOZIN 10 MG PO TABS
ORAL_TABLET | ORAL | 3 refills | Status: DC
  Filled 2021-10-23 – 2021-11-29 (×2): qty 30, 30d supply, fill #0
  Filled 2021-12-16 – 2022-02-01 (×3): qty 30, 30d supply, fill #1

## 2021-10-24 ENCOUNTER — Other Ambulatory Visit: Payer: Self-pay

## 2021-10-24 ENCOUNTER — Ambulatory Visit (INDEPENDENT_AMBULATORY_CARE_PROVIDER_SITE_OTHER): Payer: 59 | Admitting: Nurse Practitioner

## 2021-10-24 ENCOUNTER — Encounter: Payer: Self-pay | Admitting: Nurse Practitioner

## 2021-10-24 VITALS — BP 163/95 | HR 63 | Temp 97.9°F | Ht 72.0 in | Wt 230.5 lb

## 2021-10-24 DIAGNOSIS — R69 Illness, unspecified: Secondary | ICD-10-CM | POA: Diagnosis not present

## 2021-10-24 DIAGNOSIS — F10930 Alcohol use, unspecified with withdrawal, uncomplicated: Secondary | ICD-10-CM | POA: Diagnosis not present

## 2021-10-24 DIAGNOSIS — S71131S Puncture wound without foreign body, right thigh, sequela: Secondary | ICD-10-CM

## 2021-10-24 MED ORDER — ATORVASTATIN CALCIUM 40 MG PO TABS: 40.0000 mg | ORAL_TABLET | Freq: Every day | ORAL | 5 refills | Status: DC

## 2021-10-24 MED ORDER — LORAZEPAM 1 MG PO TABS: 1.0000 mg | ORAL_TABLET | Freq: Three times a day (TID) | ORAL | 0 refills | Status: AC

## 2021-10-24 MED ORDER — HYDROCODONE-ACETAMINOPHEN 5-325 MG PO TABS: ORAL_TABLET | ORAL | 0 refills | Status: DC

## 2021-10-24 MED ORDER — SPIRONOLACTONE 25 MG PO TABS: ORAL_TABLET | ORAL | 2 refills | Status: DC
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
## 2021-10-25 DIAGNOSIS — F10930 Alcohol use, unspecified with withdrawal, uncomplicated: Secondary | ICD-10-CM | POA: Insufficient documentation
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
## 2021-12-01 ENCOUNTER — Other Ambulatory Visit: Payer: Self-pay

## 2021-12-02 ENCOUNTER — Other Ambulatory Visit: Payer: Self-pay

## 2021-12-06 ENCOUNTER — Ambulatory Visit: Payer: Medicaid Other | Admitting: Nurse Practitioner

## 2021-12-11 ENCOUNTER — Ambulatory Visit: Payer: Medicaid Other | Admitting: Family

## 2021-12-16 ENCOUNTER — Other Ambulatory Visit: Payer: Self-pay

## 2021-12-17 ENCOUNTER — Encounter: Payer: Self-pay | Admitting: Family

## 2021-12-17 ENCOUNTER — Other Ambulatory Visit: Payer: Self-pay

## 2021-12-17 ENCOUNTER — Ambulatory Visit: Payer: 59 | Attending: Family | Admitting: Family

## 2021-12-17 VITALS — BP 184/119 | HR 77 | Resp 20 | Ht 72.0 in | Wt 235.0 lb

## 2021-12-17 DIAGNOSIS — F17210 Nicotine dependence, cigarettes, uncomplicated: Secondary | ICD-10-CM | POA: Diagnosis not present

## 2021-12-17 DIAGNOSIS — I110 Hypertensive heart disease with heart failure: Secondary | ICD-10-CM | POA: Diagnosis not present

## 2021-12-17 DIAGNOSIS — J449 Chronic obstructive pulmonary disease, unspecified: Secondary | ICD-10-CM | POA: Diagnosis not present

## 2021-12-17 DIAGNOSIS — M25561 Pain in right knee: Secondary | ICD-10-CM | POA: Diagnosis not present

## 2021-12-17 DIAGNOSIS — Z7901 Long term (current) use of anticoagulants: Secondary | ICD-10-CM | POA: Insufficient documentation

## 2021-12-17 DIAGNOSIS — Z720 Tobacco use: Secondary | ICD-10-CM

## 2021-12-17 DIAGNOSIS — I5022 Chronic systolic (congestive) heart failure: Secondary | ICD-10-CM

## 2021-12-17 DIAGNOSIS — F1010 Alcohol abuse, uncomplicated: Secondary | ICD-10-CM

## 2021-12-17 DIAGNOSIS — I10 Essential (primary) hypertension: Secondary | ICD-10-CM

## 2021-12-17 DIAGNOSIS — R69 Illness, unspecified: Secondary | ICD-10-CM | POA: Diagnosis not present

## 2021-12-17 DIAGNOSIS — R5383 Other fatigue: Secondary | ICD-10-CM | POA: Insufficient documentation

## 2021-12-17 DIAGNOSIS — Z79899 Other long term (current) drug therapy: Secondary | ICD-10-CM | POA: Insufficient documentation

## 2021-12-17 DIAGNOSIS — I509 Heart failure, unspecified: Secondary | ICD-10-CM | POA: Diagnosis not present

## 2021-12-17 DIAGNOSIS — F1090 Alcohol use, unspecified, uncomplicated: Secondary | ICD-10-CM | POA: Insufficient documentation

## 2021-12-17 NOTE — Patient Instructions (Addendum)
Continue weighing daily and call for an overnight weight gain of 3 pounds or more or a weekly weight gain of more than 5 pounds.   If you have voicemail, please make sure your mailbox is cleaned out so that we may leave a message and please make sure to listen to any voicemails.    Would like to see you back in the office in a couple of months. Please call us when you know that you will be in town from work and we will see you.
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
## 2021-12-18 ENCOUNTER — Encounter: Payer: Self-pay | Admitting: Family

## 2021-12-24 ENCOUNTER — Ambulatory Visit: Payer: Medicaid Other | Admitting: Nurse Practitioner

## 2021-12-24 ENCOUNTER — Other Ambulatory Visit: Payer: Self-pay

## 2021-12-25 ENCOUNTER — Other Ambulatory Visit: Payer: Self-pay

## 2021-12-29 ENCOUNTER — Other Ambulatory Visit: Payer: Self-pay

## 2021-12-30 ENCOUNTER — Other Ambulatory Visit: Payer: Self-pay

## 2022-01-26 ENCOUNTER — Other Ambulatory Visit: Payer: Self-pay | Admitting: Family

## 2022-01-26 ENCOUNTER — Other Ambulatory Visit: Payer: Self-pay

## 2022-01-26 MED FILL — Losartan Potassium Tab 100 MG: ORAL | 30 days supply | Qty: 30 | Fill #0 | Status: AC

## 2022-01-27 ENCOUNTER — Other Ambulatory Visit: Payer: Self-pay

## 2022-01-29 ENCOUNTER — Other Ambulatory Visit: Payer: Self-pay | Admitting: Family

## 2022-01-29 ENCOUNTER — Other Ambulatory Visit: Payer: Self-pay

## 2022-02-03 ENCOUNTER — Other Ambulatory Visit: Payer: Self-pay

## 2022-02-04 ENCOUNTER — Other Ambulatory Visit: Payer: Self-pay

## 2022-02-04 NOTE — Telephone Encounter (Signed)
Fax came over for inhaler looks as if this was D/C . Will not send in until pt appointment.   Elyse Jarvis RMA

## 2022-02-05 ENCOUNTER — Other Ambulatory Visit: Payer: Self-pay | Admitting: Family

## 2022-02-05 ENCOUNTER — Telehealth: Payer: Self-pay | Admitting: Nurse Practitioner

## 2022-02-05 ENCOUNTER — Other Ambulatory Visit: Payer: Self-pay | Admitting: Nurse Practitioner

## 2022-02-05 MED ORDER — CARVEDILOL 25 MG PO TABS: 25.0000 mg | ORAL_TABLET | Freq: Two times a day (BID) | ORAL | 2 refills | Status: DC

## 2022-02-05 MED ORDER — LOSARTAN POTASSIUM 100 MG PO TABS: ORAL_TABLET | ORAL | 5 refills | Status: DC

## 2022-02-05 NOTE — Telephone Encounter (Signed)
Caller & Relationship to patient: patient   MRN #  [id number]   Call Back Number: [phone]  Date of Last Office Visit: 02/04/2022     Date of Next Office Visit: 03/12/2022    Medication(s) to be Refilled: carvedilol   Preferred Pharmacy:   ** Please notify patient to allow 48-72 hours to process** **Let patient know to contact pharmacy at the end of the day to make sure medication is ready. ** **If patient has not been seen in a year or longer, book an appointment **Advise to use MyChart for refill requests OR to contact their pharmacy

## 2022-02-06 ENCOUNTER — Other Ambulatory Visit: Payer: Self-pay

## 2022-02-06 MED ORDER — CARVEDILOL 25 MG PO TABS: 25.0000 mg | ORAL_TABLET | Freq: Two times a day (BID) | ORAL | 1 refills | Status: DC

## 2022-02-12 ENCOUNTER — Ambulatory Visit: Payer: Medicaid Other | Admitting: Nurse Practitioner

## 2022-02-26 NOTE — Telephone Encounter (Signed)
Caller & Relationship to patient:  MRN #  [id number]   Call Back Number:   Date of Last Office Visit: 02/06/2022     Date of Next Office Visit: 03/12/2022    Medication(s) to be Refilled: albuterol inhaler  Preferred Pharmacy:   ** Please notify patient to allow 48-72 hours to process** **Let patient know to contact pharmacy at the end of the day to make sure medication is ready. ** **If patient has not been seen in a year or longer, book an appointment **Advise to use MyChart for refill requests OR to contact their pharmacy

## 2022-02-27 ENCOUNTER — Ambulatory Visit (INDEPENDENT_AMBULATORY_CARE_PROVIDER_SITE_OTHER): Payer: Medicaid Other | Admitting: Nurse Practitioner

## 2022-02-27 ENCOUNTER — Other Ambulatory Visit: Payer: Self-pay

## 2022-02-27 ENCOUNTER — Other Ambulatory Visit: Payer: Self-pay | Admitting: Family

## 2022-02-27 ENCOUNTER — Encounter: Payer: Self-pay | Admitting: Nurse Practitioner

## 2022-02-27 VITALS — BP 173/80 | HR 62 | Ht 72.0 in | Wt 224.0 lb

## 2022-02-27 DIAGNOSIS — R6889 Other general symptoms and signs: Secondary | ICD-10-CM | POA: Diagnosis not present

## 2022-02-27 DIAGNOSIS — Z131 Encounter for screening for diabetes mellitus: Secondary | ICD-10-CM

## 2022-02-27 DIAGNOSIS — J069 Acute upper respiratory infection, unspecified: Secondary | ICD-10-CM | POA: Diagnosis not present

## 2022-02-27 LAB — POCT GLYCOSYLATED HEMOGLOBIN (HGB A1C): Hemoglobin A1C: 5.5 % (ref 4.0–5.6)

## 2022-02-27 LAB — POC COVID19 BINAXNOW: SARS Coronavirus 2 Ag: NEGATIVE

## 2022-02-27 LAB — POC INFLUENZA A&B (BINAX/QUICKVUE): Influenza A, POC: NEGATIVE

## 2022-02-27 MED ORDER — PREDNISONE 20 MG PO TABS: 20.0000 mg | ORAL_TABLET | Freq: Every day | ORAL | 0 refills | Status: AC

## 2022-02-27 MED ORDER — SPIRONOLACTONE 25 MG PO TABS: ORAL_TABLET | ORAL | 2 refills | Status: DC

## 2022-02-27 MED ORDER — ALBUTEROL SULFATE HFA 108 (90 BASE) MCG/ACT IN AERS: 2.0000 | INHALATION_SPRAY | Freq: Four times a day (QID) | RESPIRATORY_TRACT | 0 refills | Status: DC | PRN

## 2022-02-27 MED ORDER — LOSARTAN POTASSIUM 100 MG PO TABS: ORAL_TABLET | ORAL | 5 refills | Status: DC

## 2022-02-27 MED ORDER — EMPAGLIFLOZIN 10 MG PO TABS
ORAL_TABLET | ORAL | 3 refills | Status: DC
Start: 2022-02-27 — End: 2023-03-23

## 2022-02-27 MED ORDER — ATORVASTATIN CALCIUM 40 MG PO TABS: 40.0000 mg | ORAL_TABLET | Freq: Every day | ORAL | 5 refills | Status: DC

## 2022-02-27 MED ORDER — AMOXICILLIN-POT CLAVULANATE 875-125 MG PO TABS: 1.0000 | ORAL_TABLET | Freq: Two times a day (BID) | ORAL | 0 refills | Status: DC

## 2022-02-27 MED ORDER — BENZONATATE 200 MG PO CAPS: 200.0000 mg | ORAL_CAPSULE | Freq: Two times a day (BID) | ORAL | 0 refills | Status: DC | PRN

## 2022-02-27 MED ORDER — CARVEDILOL 25 MG PO TABS
25.0000 mg | ORAL_TABLET | Freq: Two times a day (BID) | ORAL | 1 refills | Status: DC
Start: 2022-02-27 — End: 2022-12-22

## 2022-02-27 NOTE — Patient Instructions (Addendum)
1. Flu-like symptoms  - POC COVID-19 - negative - POC Influenza A&B (Binax test) - negative   2. Diabetes mellitus screening  - POCT glycosylated hemoglobin (Hb A1C)  3. Upper respiratory tract infection, unspecified type  - amoxicillin-clavulanate (AUGMENTIN) 875-125 MG tablet; Take 1 tablet by mouth 2 (two) times daily.  Dispense: 20 tablet; Refill: 0 - predniSONE (DELTASONE) 20 MG tablet; Take 1 tablet (20 mg total) by mouth daily with breakfast for 5 days.  Dispense: 5 tablet; Refill: 0 - benzonatate (TESSALON) 200 MG capsule; Take 1 capsule (200 mg total) by mouth 2 (two) times daily as needed for cough.  Dispense: 20 capsule; Refill: 0    Follow up:  Follow up in 3 months or sooner

## 2022-02-27 NOTE — Assessment & Plan Note (Signed)
-   POC COVID-19 - negative - POC Influenza A&B (Binax test) - negative   2. Diabetes mellitus screening  - POCT glycosylated hemoglobin (Hb A1C)  3. Upper respiratory tract infection, unspecified type  - amoxicillin-clavulanate (AUGMENTIN) 875-125 MG tablet; Take 1 tablet by mouth 2 (two) times daily.  Dispense: 20 tablet; Refill: 0 - predniSONE (DELTASONE) 20 MG tablet; Take 1 tablet (20 mg total) by mouth daily with breakfast for 5 days.  Dispense: 5 tablet; Refill: 0 - benzonatate (TESSALON) 200 MG capsule; Take 1 capsule (200 mg total) by mouth 2 (two) times daily as needed for cough.  Dispense: 20 capsule; Refill: 0    Follow up:  Follow up in 3 months or sooner
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
## 2022-02-28 ENCOUNTER — Other Ambulatory Visit: Payer: Self-pay

## 2022-02-28 ENCOUNTER — Telehealth: Payer: Self-pay

## 2022-02-28 MED ORDER — ALBUTEROL SULFATE HFA 108 (90 BASE) MCG/ACT IN AERS: 2.0000 | INHALATION_SPRAY | Freq: Four times a day (QID) | RESPIRATORY_TRACT | 0 refills | Status: DC | PRN

## 2022-02-28 NOTE — Telephone Encounter (Signed)
From: [name] To: Office of Fenton Foy, NP Sent: 02/27/2022 2:39 PM EST Subject: Medication Renewal Request  Refills have been requested for the following medications:   albuterol (VENTOLIN HFA) 108 (90 Base) MCG/ACT inhaler Kenney Houseman [address] [place]]  Preferred pharmacy: [facility], [place] [place] Delivery method: [employer]

## 2022-02-28 NOTE — Telephone Encounter (Signed)
[employer] prior auth approved until 02/28/2023. Covermymeds Key: [id number]

## 2022-03-12 ENCOUNTER — Ambulatory Visit: Payer: Medicaid Other | Admitting: Nurse Practitioner

## 2022-04-17 ENCOUNTER — Emergency Department
Admission: EM | Admit: 2022-04-17 | Discharge: 2022-04-17 | Disposition: A | Discharge status: Home / Self Care | Payer: Medicaid Other | Attending: Emergency Medicine | Admitting: Emergency Medicine

## 2022-04-17 ENCOUNTER — Other Ambulatory Visit: Payer: Self-pay

## 2022-04-17 ENCOUNTER — Emergency Department: Payer: Medicaid Other

## 2022-04-17 DIAGNOSIS — R0789 Other chest pain: Secondary | ICD-10-CM | POA: Diagnosis not present

## 2022-04-17 DIAGNOSIS — E871 Hypo-osmolality and hyponatremia: Secondary | ICD-10-CM | POA: Diagnosis not present

## 2022-04-17 DIAGNOSIS — I2510 Atherosclerotic heart disease of native coronary artery without angina pectoris: Secondary | ICD-10-CM | POA: Insufficient documentation

## 2022-04-17 DIAGNOSIS — I110 Hypertensive heart disease with heart failure: Secondary | ICD-10-CM | POA: Diagnosis not present

## 2022-04-17 DIAGNOSIS — I509 Heart failure, unspecified: Secondary | ICD-10-CM | POA: Diagnosis not present

## 2022-04-17 DIAGNOSIS — R079 Chest pain, unspecified: Secondary | ICD-10-CM

## 2022-04-17 LAB — CBC
HCT: 47.6 % (ref 39.0–52.0)
Hemoglobin: 15.7 g/dL (ref 13.0–17.0)
MCH: 30.2 pg (ref 26.0–34.0)
MCHC: 33 g/dL (ref 30.0–36.0)
MCV: 91.5 fL (ref 80.0–100.0)
Platelets: 282 10*3/uL (ref 150–400)
RBC: 5.2 MIL/uL (ref 4.22–5.81)
RDW: 12.6 % (ref 11.5–15.5)
WBC: 9.9 10*3/uL (ref 4.0–10.5)
nRBC: 0 % (ref 0.0–0.2)

## 2022-04-17 LAB — TROPONIN I (HIGH SENSITIVITY)
Troponin I (High Sensitivity): 19 ng/L — ABNORMAL HIGH (ref <18)
Troponin I (High Sensitivity): 20 ng/L — ABNORMAL HIGH (ref <18)

## 2022-04-17 LAB — BASIC METABOLIC PANEL
Anion gap: 15 (ref 5–15)
BUN: 22 mg/dL — ABNORMAL HIGH (ref 6–20)
CO2: 19 mmol/L — ABNORMAL LOW (ref 22–32)
Calcium: 8.7 mg/dL — ABNORMAL LOW (ref 8.9–10.3)
Chloride: 99 mmol/L (ref 98–111)
Creatinine, Ser: 1.08 mg/dL (ref 0.61–1.24)
GFR, Estimated: >60 mL/min (ref >60)
Glucose, Bld: 114 mg/dL — ABNORMAL HIGH (ref 70–99)
Potassium: 3.2 mmol/L — ABNORMAL LOW (ref 3.5–5.1)
Sodium: 133 mmol/L — ABNORMAL LOW (ref 135–145)

## 2022-04-17 MED ORDER — ALUM & MAG HYDROXIDE-SIMETH 200-200-20 MG/5ML PO SUSP
30.0000 mL | Freq: Once | ORAL | Status: AC
  Administered 2022-04-17: 30 mL via ORAL
  Filled 2022-04-17: qty 30

## 2022-04-17 MED ORDER — LIDOCAINE VISCOUS HCL 2 % MT SOLN
15.0000 mL | Freq: Once | OROMUCOSAL | Status: AC
  Administered 2022-04-17: 15 mL via ORAL
  Filled 2022-04-17: qty 15

## 2022-04-17 MED ORDER — SODIUM CHLORIDE 0.9 % IV BOLUS
1000.0000 mL | Freq: Once | INTRAVENOUS | Status: AC
  Administered 2022-04-17: 1000 mL via INTRAVENOUS

## 2022-04-17 NOTE — Discharge Instructions (Signed)
Please seek medical attention for any high fevers, chest pain, shortness of breath, change in behavior, persistent vomiting, bloody stool or any other new or concerning symptoms.  

## 2022-04-17 NOTE — ED Provider Notes (Signed)
[facility] Provider Note    Event Date/Time   First MD Initiated Contact with Patient 04/17/22 2144     (approximate)   History   Chest Pain   HPI  Nicholas Chung is a 57 y.o. male who presents to the emergency department today because of concerns for chest pain.  He tells me that he first noticed it this morning upon awakening.  Located in the left chest.  Very slight radiation to the right arm.  This was accompanied by some lightheadedness.  Said he had some shortness of breath.  Does have history of coronary artery disease and was concerned because this did somewhat remind him of previous episodes of cardiac disease.  He denies any unusual activity or exertion yesterday. Denies any fevers.     Physical Exam   Triage Vital Signs: ED Triage Vitals  Enc Vitals Group     BP 04/17/22 1807 116/70     Pulse Rate 04/17/22 1807 75     Resp 04/17/22 1807 20     Temp 04/17/22 1807 98.4 F (36.9 C)     Temp Source 04/17/22 1807 Oral     SpO2 04/17/22 1811 95 %     Weight 04/17/22 1808 230 lb (104.3 kg)     Height 04/17/22 1808 6' (1.829 m)     Head Circumference --      Peak Flow --      Pain Score 04/17/22 1808 4     Pain Loc --      Pain Edu? --      Excl. in [place]? --     Most recent vital signs: Vitals:   04/17/22 1807 04/17/22 1811  BP: 116/70   Pulse: 75   Resp: 20   Temp: 98.4 F (36.9 C)   SpO2:  95%    General: Awake, alert, oriented. CV:  Good peripheral perfusion. Regular rate and rhythm. Resp:  Normal effort. Lungs clear. Abd:  No distention.   ED Results / Procedures / Treatments   Labs (all labs ordered are listed, but only abnormal results are displayed) Labs Reviewed  BASIC METABOLIC PANEL - Abnormal; Notable for the following components:      Result Value   Sodium 133 (*)    Potassium 3.2 (*)    CO2 19 (*)    Glucose, Bld 114 (*)    BUN 22 (*)    Calcium 8.7 (*)    All other components within normal limits  TROPONIN  I (HIGH SENSITIVITY) - Abnormal; Notable for the following components:   Troponin I (High Sensitivity) 19 (*)    All other components within normal limits  CBC  TROPONIN I (HIGH SENSITIVITY)     EKG  I, Nance Pear, attending physician, personally viewed and interpreted this EKG  EKG Time: 1808 Rate: 77 Rhythm: normal sinus rhythm Axis: left axis deviation Intervals: qtc 491 QRS: narrow ST changes: no st elevation Impression: abnormal ekg  RADIOLOGY I independently interpreted and visualized the CXR. My interpretation: No pneumonia Radiology interpretation:  IMPRESSION:  Mild perihilar bronchial wall thickening, can be seen with  bronchitis. No focal consolidation.     PROCEDURES:  Critical Care performed: No  Procedures   MEDICATIONS ORDERED IN ED: Medications  sodium chloride 0.9 % bolus 1,000 mL (has no administration in time range)  alum & mag hydroxide-simeth (MAALOX/MYLANTA) 200-200-20 MG/5ML suspension 30 mL (has no administration in time range)    And  lidocaine (XYLOCAINE) 2 %  viscous mouth solution 15 mL (has no administration in time range)     IMPRESSION / MDM / ASSESSMENT AND PLAN / ED COURSE  I reviewed the triage vital signs and the nursing notes.                              Differential diagnosis includes, but is not limited to, acs, pneumonia, pneumothorax, esophagitis.  Patient's presentation is most consistent with acute presentation with potential threat to life or bodily function.  Patient presented to the emergency department today because of concerns for chest pain.  Also complained of some dizziness.  Vital signs here without concerning hypotension.  Chest x-ray without pneumonia or pneumothorax.  Initial troponin slightly elevated however repeat without any significant change.  Per chart review does have history of somewhat elevated troponin so I have a low suspicion for ACS at this time.  Blood work with some slight hyponatremia.   Think he is slightly dehydrated.  Discussed this with patient.  Did give IV fluids.  Given reassuring workup at this time I think is reasonable for patient be discharged home.  I have low suspicion for PE or dissection.  Did discuss strict return precautions with the patient.   FINAL CLINICAL IMPRESSION(S) / ED DIAGNOSES   Final diagnoses:  Nonspecific chest pain     Note:  This document was prepared using Dragon voice recognition software and may include unintentional dictation errors.    Nance Pear, MD 04/17/22 [phone]

## 2022-04-17 NOTE — ED Triage Notes (Signed)
Pt with c/o chest pain starting last night after drinking a fifth of whiskey. Pt with hx of htn and CHF. Pt with c/o lightheadedness and bp of 87/55 at home. Pt states some SOB and tiredness.

## 2022-04-24 ENCOUNTER — Other Ambulatory Visit: Payer: Self-pay | Admitting: Nurse Practitioner

## 2022-04-24 NOTE — Telephone Encounter (Signed)
Please advise KH 

## 2022-05-09 ENCOUNTER — Emergency Department: Payer: Medicaid Other

## 2022-05-09 ENCOUNTER — Other Ambulatory Visit: Payer: Self-pay

## 2022-05-09 ENCOUNTER — Emergency Department
Admission: EM | Admit: 2022-05-09 | Discharge: 2022-05-09 | Disposition: A | Discharge status: Home / Self Care | Payer: Medicaid Other | Attending: Emergency Medicine | Admitting: Emergency Medicine

## 2022-05-09 DIAGNOSIS — E860 Dehydration: Secondary | ICD-10-CM | POA: Diagnosis not present

## 2022-05-09 DIAGNOSIS — I959 Hypotension, unspecified: Secondary | ICD-10-CM | POA: Diagnosis not present

## 2022-05-09 DIAGNOSIS — F10929 Alcohol use, unspecified with intoxication, unspecified: Secondary | ICD-10-CM | POA: Diagnosis not present

## 2022-05-09 DIAGNOSIS — R079 Chest pain, unspecified: Secondary | ICD-10-CM | POA: Diagnosis not present

## 2022-05-09 DIAGNOSIS — R4182 Altered mental status, unspecified: Secondary | ICD-10-CM | POA: Diagnosis not present

## 2022-05-09 DIAGNOSIS — F1010 Alcohol abuse, uncomplicated: Secondary | ICD-10-CM | POA: Insufficient documentation

## 2022-05-09 DIAGNOSIS — R55 Syncope and collapse: Secondary | ICD-10-CM | POA: Diagnosis not present

## 2022-05-09 DIAGNOSIS — R42 Dizziness and giddiness: Secondary | ICD-10-CM | POA: Diagnosis not present

## 2022-05-09 DIAGNOSIS — I951 Orthostatic hypotension: Secondary | ICD-10-CM | POA: Diagnosis not present

## 2022-05-09 DIAGNOSIS — Z743 Need for continuous supervision: Secondary | ICD-10-CM | POA: Diagnosis not present

## 2022-05-09 DIAGNOSIS — F1410 Cocaine abuse, uncomplicated: Secondary | ICD-10-CM | POA: Diagnosis not present

## 2022-05-09 DIAGNOSIS — Y903 Blood alcohol level of 60-79 mg/100 ml: Secondary | ICD-10-CM | POA: Insufficient documentation

## 2022-05-09 LAB — COMPREHENSIVE METABOLIC PANEL
ALT: 20 U/L (ref 0–44)
AST: 35 U/L (ref 15–41)
Albumin: 3.6 g/dL (ref 3.5–5.0)
Alkaline Phosphatase: 79 U/L (ref 38–126)
Anion gap: 14 (ref 5–15)
BUN: 25 mg/dL — ABNORMAL HIGH (ref 6–20)
CO2: 20 mmol/L — ABNORMAL LOW (ref 22–32)
Calcium: 8.4 mg/dL — ABNORMAL LOW (ref 8.9–10.3)
Chloride: 106 mmol/L (ref 98–111)
Creatinine, Ser: 1.13 mg/dL (ref 0.61–1.24)
GFR, Estimated: >60 mL/min (ref >60)
Glucose, Bld: 80 mg/dL (ref 70–99)
Potassium: 4.1 mmol/L (ref 3.5–5.1)
Sodium: 140 mmol/L (ref 135–145)
Total Bilirubin: 1.7 mg/dL — ABNORMAL HIGH (ref 0.3–1.2)
Total Protein: 6.7 g/dL (ref 6.5–8.1)

## 2022-05-09 LAB — URINALYSIS, W/ REFLEX TO CULTURE (INFECTION SUSPECTED)
Bacteria, UA: NONE SEEN
Bilirubin Urine: NEGATIVE
Glucose, UA: >=500 mg/dL — AB
Hgb urine dipstick: NEGATIVE
Ketones, ur: 20 mg/dL — AB
Leukocytes,Ua: NEGATIVE
Nitrite: NEGATIVE
Protein, ur: 100 mg/dL — AB
Specific Gravity, Urine: 1.027 (ref 1.005–1.030)
pH: 5 (ref 5.0–8.0)

## 2022-05-09 LAB — CBC WITH DIFFERENTIAL/PLATELET
Abs Immature Granulocytes: 0.04 10*3/uL (ref 0.00–0.07)
Basophils Absolute: 0 10*3/uL (ref 0.0–0.1)
Basophils Relative: 0 %
Eosinophils Absolute: 0.1 10*3/uL (ref 0.0–0.5)
Eosinophils Relative: 1 %
HCT: 46.2 % (ref 39.0–52.0)
Hemoglobin: 15.3 g/dL (ref 13.0–17.0)
Immature Granulocytes: 1 %
Lymphocytes Relative: 27 %
Lymphs Abs: 2.3 10*3/uL (ref 0.7–4.0)
MCH: 30.5 pg (ref 26.0–34.0)
MCHC: 33.1 g/dL (ref 30.0–36.0)
MCV: 92.2 fL (ref 80.0–100.0)
Monocytes Absolute: 0.8 10*3/uL (ref 0.1–1.0)
Monocytes Relative: 9 %
Neutro Abs: 5.5 10*3/uL (ref 1.7–7.7)
Neutrophils Relative %: 62 %
Platelets: 263 10*3/uL (ref 150–400)
RBC: 5.01 MIL/uL (ref 4.22–5.81)
RDW: 13.4 % (ref 11.5–15.5)
WBC: 8.8 10*3/uL (ref 4.0–10.5)
nRBC: 0 % (ref 0.0–0.2)

## 2022-05-09 LAB — URINE DRUG SCREEN, QUALITATIVE (ARMC ONLY)
Amphetamines, Ur Screen: NOT DETECTED
Barbiturates, Ur Screen: NOT DETECTED
Benzodiazepine, Ur Scrn: POSITIVE — AB
Cannabinoid 50 Ng, Ur ~~LOC~~: NOT DETECTED
Cocaine Metabolite,Ur ~~LOC~~: POSITIVE — AB
MDMA (Ecstasy)Ur Screen: NOT DETECTED
Methadone Scn, Ur: NOT DETECTED
Opiate, Ur Screen: NOT DETECTED
Phencyclidine (PCP) Ur S: NOT DETECTED
Tricyclic, Ur Screen: NOT DETECTED

## 2022-05-09 LAB — PROTIME-INR
INR: 1 (ref 0.8–1.2)
Prothrombin Time: 13.2 seconds (ref 11.4–15.2)

## 2022-05-09 LAB — LACTIC ACID, PLASMA
Lactic Acid, Venous: 3.4 mmol/L (ref 0.5–1.9)
Lactic Acid, Venous: 2.7 mmol/L (ref 0.5–1.9)
Lactic Acid, Venous: 1.9 mmol/L (ref 0.5–1.9)

## 2022-05-09 LAB — ETHANOL: Alcohol, Ethyl (B): 61 mg/dL — ABNORMAL HIGH (ref <10)

## 2022-05-09 LAB — APTT: aPTT: 30 seconds (ref 24–36)

## 2022-05-09 MED ORDER — LACTATED RINGERS IV SOLN: INTRAVENOUS | Status: DC

## 2022-05-09 MED ORDER — LACTATED RINGERS IV BOLUS (SEPSIS)
1000.0000 mL | Freq: Once | INTRAVENOUS | Status: AC
  Administered 2022-05-09: 1000 mL via INTRAVENOUS

## 2022-05-09 MED ORDER — SODIUM CHLORIDE 0.9 % IV BOLUS
1000.0000 mL | Freq: Once | INTRAVENOUS | Status: AC
  Administered 2022-05-09: 1000 mL via INTRAVENOUS

## 2022-05-09 MED ORDER — SODIUM CHLORIDE 0.9 % IV SOLN: 2.0000 g | Freq: Once | INTRAVENOUS | Status: DC

## 2022-05-09 MED ORDER — VANCOMYCIN HCL 2000 MG/400ML IV SOLN
2000.0000 mg | Freq: Once | INTRAVENOUS | Status: DC
  Filled 2022-05-09: qty 400

## 2022-05-09 MED ORDER — VANCOMYCIN HCL IN DEXTROSE 1-5 GM/200ML-% IV SOLN: 1000.0000 mg | Freq: Once | INTRAVENOUS | Status: DC

## 2022-05-09 NOTE — ED Notes (Signed)
Date and time results received: 05/09/22 1120  Test: lactic acid Critical Value: 3.4   Name of Provider Notified: Dr. Jori Moll

## 2022-05-09 NOTE — ED Triage Notes (Signed)
Pt arived via [facility] with c/o light headedness and "feeling like I'm going to pass out." Pt has a hx of cocaine use (smokes it, last use last night), also drank a fifth and a half last night. Pt reports CP 3/10 and tingling in right arm, hx of hypertension and took BP meds this morning. Sees a cardiologist at [facility] and says "bottom of left side of heart not working."

## 2022-05-09 NOTE — ED Provider Notes (Addendum)
[facility] Provider Note    Event Date/Time   First MD Initiated Contact with Patient 05/09/22 [phone]     (approximate)   History   Hypotension   HPI  Nicholas Chung is a 57 y.o. male presents to the emergency  for low blood pressure.  Patient Nicholas Chung is drinking alcohol and using cocaine last night.  Last drank alcohol just prior to arrival and states that he drank 1/5 of alcohol.  When EMS arrived his blood pressure was low at 70 systolic.  Received 600 cc of IV fluids prior to arrival with improvement of his blood pressure.  States that he was in his normal state of health last night.  Denies any change in vision.  Denies any headache.  No falls or trauma.  Denies any abdominal pain nausea or vomiting or diarrhea.  No blood in his stool.     Physical Exam   Triage Vital Signs: ED Triage Vitals  Enc Vitals Group     BP 05/09/22 1006 108/62     Pulse Rate 05/09/22 1006 62     Resp 05/09/22 1006 19     Temp 05/09/22 1006 98.4 F (36.9 C)     Temp Source 05/09/22 1006 Oral     SpO2 05/09/22 1006 94 %     Weight 05/09/22 1008 218 lb 14.7 oz (99.3 kg)     Height 05/09/22 1008 6' (1.829 m)     Head Circumference --      Peak Flow --      Pain Score 05/09/22 1007 3     Pain Loc --      Pain Edu? --      Excl. in [place]? --     Most recent vital signs: Vitals:   05/09/22 1330 05/09/22 1504  BP: (!) 139/99 (!) 142/122  Pulse: 67 72  Resp: (!) 21 20  Temp:    SpO2: 99% 99%    Physical Exam Constitutional:      Appearance: He is well-developed.  HENT:     Head: Atraumatic.  Eyes:     Extraocular Movements: Extraocular movements intact.     Conjunctiva/sclera: Conjunctivae normal.     Pupils: Pupils are equal, round, and reactive to light.  Cardiovascular:     Rate and Rhythm: Regular rhythm.  Pulmonary:     Effort: No respiratory distress.  Musculoskeletal:        General: Normal range of motion.     Cervical back: Normal range of motion.      Right lower leg: No edema.     Left lower leg: No edema.  Skin:    General: Skin is warm.     Capillary Refill: Capillary refill takes less than 2 seconds.  Neurological:     Mental Status: He is alert. Mental status is at baseline.     IMPRESSION / MDM / ASSESSMENT AND PLAN / ED COURSE  I reviewed the triage vital signs and the nursing notes.  Differential diagnosis including infectious process, dehydration, alcohol abuse, electrolyte derangement, intracranial hemorrhage  On chart review patient had a similar episode recently and was evaluated in the emergency department.  EKG  I, Nathaniel Man, the attending physician, personally viewed and interpreted this ECG.   Rate: Normal  Rhythm: Normal sinus  Axis: Normal  Intervals: Normal  ST&T Change: None  No tachycardic or bradycardic dysrhythmias while on cardiac telemetry.  RADIOLOGY I independently reviewed imaging, my interpretation of imaging: CT  head and chest x-ray.  No acute findings.  Read as no acute findings.  LABS (all labs ordered are listed, but only abnormal results are displayed) Labs interpreted as -    Labs Reviewed  LACTIC ACID, PLASMA - Abnormal; Notable for the following components:      Result Value   Lactic Acid, Venous 3.4 (*)    All other components within normal limits  LACTIC ACID, PLASMA - Abnormal; Notable for the following components:   Lactic Acid, Venous 2.7 (*)    All other components within normal limits  COMPREHENSIVE METABOLIC PANEL - Abnormal; Notable for the following components:   CO2 20 (*)    BUN 25 (*)    Calcium 8.4 (*)    Total Bilirubin 1.7 (*)    All other components within normal limits  URINALYSIS, W/ REFLEX TO CULTURE (INFECTION SUSPECTED) - Abnormal; Notable for the following components:   Color, Urine AMBER (*)    APPearance CLOUDY (*)    Glucose, UA >=500 (*)    Ketones, ur 20 (*)    Protein, ur 100 (*)    All other components within normal limits  URINE DRUG  SCREEN, QUALITATIVE ([facility] ONLY) - Abnormal; Notable for the following components:   Cocaine Metabolite,Ur [place] POSITIVE (*)    Benzodiazepine, Ur Scrn POSITIVE (*)    All other components within normal limits  ETHANOL - Abnormal; Notable for the following components:   Alcohol, Ethyl (B) 61 (*)    All other components within normal limits  CULTURE, BLOOD (ROUTINE X 2)  CULTURE, BLOOD (ROUTINE X 2)  CBC WITH DIFFERENTIAL/PLATELET  PROTIME-INR  APTT  LACTIC ACID, PLASMA  CBC WITH DIFFERENTIAL/PLATELET    TREATMENT  2 L of IV fluid  MDM    Initially concern for possible sepsis given his hypotension and elevated lactic acid of 3.4.  Blood cultures was added on.  After further discussion with the patient he states that this is what happened in the past and likely has an elevated lactic acid secondary to his known alcohol use and liver issues.  Will give 1 L of IV fluids and reevaluate.  Felt that 30 cc/kg of IV fluids may did be detrimental given improvement of his hypotension with 1 L of LR.  On reevaluation blood pressure improved.  No significant leukocytosis or signs of an infectious process.  Chest x-ray with no signs of pneumonia.  UA with no signs of urinary tract infection.  Lactic acid improved to 2.7.  Given another 1 L of IV fluids.  No altered mental status, have a low suspicion for hepatic encephalopathy.  Clinical picture is not consistent with a Warnicke's encephalopathy.  Orthostatic blood pressures are negative.  On reevaluation patient states that he is feeling better.  Will do a third lactic acid and if downtrending will discharge the patient with information to follow-up outpatient in order to get help for his cocaine and alcohol use disorder.  Discussed follow-up with his primary care physician and given strict return precautions to the emergency department for any worsening symptoms.   PROCEDURES:  Critical Care performed: yes .Critical Care  Performed by: Nathaniel Man, MD Authorized by: Nathaniel Man, MD   Critical care provider statement:    Critical care time (minutes):  30   Critical care time was exclusive of:  Separately billable procedures and treating other patients   Critical care was necessary to treat or prevent imminent or life-threatening deterioration of the following conditions:  Dehydration  Critical care was time spent personally by me on the following activities:  Development of treatment plan with patient or surrogate, discussions with consultants, evaluation of patient's response to treatment, examination of patient, ordering and review of laboratory studies, ordering and review of radiographic studies, ordering and performing treatments and interventions, pulse oximetry, re-evaluation of patient's condition and review of old charts   Patient's presentation is most consistent with acute presentation with potential threat to life or bodily function.   MEDICATIONS ORDERED IN ED: Medications  lactated ringers infusion (has no administration in time range)  sodium chloride 0.9 % bolus 1,000 mL (0 mLs Intravenous Stopped 05/09/22 1130)  sodium chloride 0.9 % bolus 1,000 mL (0 mLs Intravenous Stopped 05/09/22 1253)  lactated ringers bolus 1,000 mL (1,000 mLs Intravenous New Bag/Given 05/09/22 1259)    FINAL CLINICAL IMPRESSION(S) / ED DIAGNOSES   Final diagnoses:  Alcohol abuse  Cocaine abuse (HCC)  Orthostatic hypotension  Dehydration     Rx / DC Orders   ED Discharge Orders     None        Note:  This document was prepared using Dragon voice recognition software and may include unintentional dictation errors.   Nathaniel Man, MD 05/09/22 1533    Nathaniel Man, MD 05/09/22 [phone]

## 2022-05-09 NOTE — Consult Note (Deleted)
PHARMACY -  BRIEF ANTIBIOTIC NOTE   Pharmacy has received consult(s) for cefepime and vancomycin dosing from an ED provider.  The patient's profile has been reviewed for ht/wt/allergies/indication/available labs.    One time order(s) placed for cefepime 2 grams IV x 1 and Vancomycin 2 grams IV x 1  Further antibiotics/pharmacy consults should be ordered by admitting physician if indicated.                       Thank you, Lorin Picket, PharmD 05/09/2022  12:32 PM

## 2022-05-09 NOTE — Progress Notes (Signed)
Code sepsis followed but no antibiotics given. See ED provider note.

## 2022-05-09 NOTE — Progress Notes (Signed)
[facility]link is following code sepsis 

## 2022-05-09 NOTE — Discharge Instructions (Addendum)
Seen in the emergency department for low blood pressure.  Concern that this is from not eating or drinking from your alcohol use disorder.  It is important that you stay hydrated and drink plenty of fluids.  Follow-up as an outpatient to try to get help with your alcohol use disorder.  Is important that you have a well-balanced diet.  Talk to your primary care physician about any further vitamin deficiencies.  Return to the emergency department for any worsening symptoms.

## 2022-05-10 ENCOUNTER — Other Ambulatory Visit: Payer: Self-pay

## 2022-05-10 ENCOUNTER — Telehealth: Payer: Self-pay | Admitting: Emergency Medicine

## 2022-05-10 ENCOUNTER — Emergency Department
Admission: EM | Admit: 2022-05-10 | Discharge: 2022-05-10 | Disposition: A | Discharge status: Home / Self Care | Payer: Medicaid Other | Attending: Emergency Medicine | Admitting: Emergency Medicine

## 2022-05-10 ENCOUNTER — Other Ambulatory Visit: Payer: Self-pay | Admitting: Infectious Diseases

## 2022-05-10 DIAGNOSIS — R799 Abnormal finding of blood chemistry, unspecified: Secondary | ICD-10-CM | POA: Diagnosis not present

## 2022-05-10 DIAGNOSIS — F10239 Alcohol dependence with withdrawal, unspecified: Secondary | ICD-10-CM | POA: Diagnosis not present

## 2022-05-10 LAB — BLOOD CULTURE ID PANEL (REFLEXED) - BCID2

## 2022-05-10 LAB — CBC WITH DIFFERENTIAL/PLATELET
Abs Immature Granulocytes: 0.02 10*3/uL (ref 0.00–0.07)
Basophils Absolute: 0 10*3/uL (ref 0.0–0.1)
Basophils Relative: 0 %
Eosinophils Absolute: 0.2 10*3/uL (ref 0.0–0.5)
Eosinophils Relative: 2 %
HCT: 45.8 % (ref 39.0–52.0)
Hemoglobin: 14.8 g/dL (ref 13.0–17.0)
Immature Granulocytes: 0 %
Lymphocytes Relative: 28 %
Lymphs Abs: 2 10*3/uL (ref 0.7–4.0)
MCH: 30.5 pg (ref 26.0–34.0)
MCHC: 32.3 g/dL (ref 30.0–36.0)
MCV: 94.2 fL (ref 80.0–100.0)
Monocytes Absolute: 0.6 10*3/uL (ref 0.1–1.0)
Monocytes Relative: 9 %
Neutro Abs: 4.3 10*3/uL (ref 1.7–7.7)
Neutrophils Relative %: 61 %
Platelets: 249 10*3/uL (ref 150–400)
RBC: 4.86 MIL/uL (ref 4.22–5.81)
RDW: 13.2 % (ref 11.5–15.5)
WBC: 7.2 10*3/uL (ref 4.0–10.5)
nRBC: 0 % (ref 0.0–0.2)

## 2022-05-10 LAB — URINALYSIS, ROUTINE W REFLEX MICROSCOPIC
Bacteria, UA: NONE SEEN
Bilirubin Urine: NEGATIVE
Glucose, UA: >=500 mg/dL — AB
Hgb urine dipstick: NEGATIVE
Ketones, ur: NEGATIVE mg/dL
Leukocytes,Ua: NEGATIVE
Nitrite: NEGATIVE
Protein, ur: NEGATIVE mg/dL
Specific Gravity, Urine: 1.029 (ref 1.005–1.030)
Squamous Epithelial / HPF: NONE SEEN /HPF (ref 0–5)
WBC, UA: NONE SEEN WBC/hpf (ref 0–5)
pH: 7 (ref 5.0–8.0)

## 2022-05-10 LAB — COMPREHENSIVE METABOLIC PANEL
ALT: 21 U/L (ref 0–44)
AST: 27 U/L (ref 15–41)
Albumin: 3.9 g/dL (ref 3.5–5.0)
Alkaline Phosphatase: 75 U/L (ref 38–126)
Anion gap: 6 (ref 5–15)
BUN: 23 mg/dL — ABNORMAL HIGH (ref 6–20)
CO2: 24 mmol/L (ref 22–32)
Calcium: 8.6 mg/dL — ABNORMAL LOW (ref 8.9–10.3)
Chloride: 106 mmol/L (ref 98–111)
Creatinine, Ser: 0.86 mg/dL (ref 0.61–1.24)
GFR, Estimated: >60 mL/min (ref >60)
Glucose, Bld: 132 mg/dL — ABNORMAL HIGH (ref 70–99)
Potassium: 3.6 mmol/L (ref 3.5–5.1)
Sodium: 136 mmol/L (ref 135–145)
Total Bilirubin: 0.8 mg/dL (ref 0.3–1.2)
Total Protein: 6.9 g/dL (ref 6.5–8.1)

## 2022-05-10 LAB — PROCALCITONIN: Procalcitonin: <0.1 ng/mL

## 2022-05-10 LAB — LACTIC ACID, PLASMA: Lactic Acid, Venous: 2.6 mmol/L (ref 0.5–1.9)

## 2022-05-10 MED ORDER — SODIUM CHLORIDE 0.9 % IV BOLUS
1000.0000 mL | Freq: Once | INTRAVENOUS | Status: AC
  Administered 2022-05-10: 1000 mL via INTRAVENOUS

## 2022-05-10 MED ORDER — LORAZEPAM 2 MG/ML IJ SOLN
1.0000 mg | Freq: Once | INTRAMUSCULAR | Status: AC
  Administered 2022-05-10: 1 mg via INTRAVENOUS
  Filled 2022-05-10: qty 1

## 2022-05-10 MED ORDER — CHLORDIAZEPOXIDE HCL 25 MG PO CAPS: ORAL_CAPSULE | ORAL | 0 refills | Status: AC

## 2022-05-10 NOTE — Discharge Instructions (Signed)
DO NOT DRINK WHILE TAKING THE LIBRIUM  We will call you if cultures return positive again.

## 2022-05-10 NOTE — Telephone Encounter (Signed)
Pt's called by this RN at this time. Pt has positive blood cultures, per EDP Mumma pt should come back in to be seen for IV antibiotics. Per verbalized understanding that he would not be able to receive IV antibiotics via his PCP and that oral antibiotics would not be appropriate either and stated he would be here in the next 15-20 minutes.

## 2022-05-10 NOTE — ED Provider Notes (Addendum)
[facility] Provider Note    Event Date/Time   First MD Initiated Contact with Patient 05/10/22 1543     (approximate)   History   abnormal labs   HPI  Nicholas Chung is a 57 y.o. male  here with abnormal culture drawn yesterday.  The patient was in the ED yesterday after binge drinking alcohol and using cocaine.  He was hypotensive with a lactic acid elevation at that time which was thought secondary to his binging and decreased p.o. intake.  He had blood culture sent.  He felt better and returned home.  Since then, he states he has been feeling overall very well.  Is been able to eat and drink today.  Denies any fevers.  No other complaints today.  His blood cultures came back positive today so he was told to come to the ED.  They grew back positive gram-positive rods as well as staph, per review of records.  He states he feels fine today.  No fevers or chills today.     Physical Exam   Triage Vital Signs: ED Triage Vitals  Enc Vitals Group     BP 05/10/22 1547 (!) 163/85     Pulse Rate 05/10/22 1547 60     Resp 05/10/22 1547 20     Temp 05/10/22 1547 98 F (36.7 C)     Temp Source 05/10/22 1547 Oral     SpO2 05/10/22 1547 98 %     Weight 05/10/22 1548 218 lb 14.7 oz (99.3 kg)     Height 05/10/22 1548 6' (1.829 m)     Head Circumference --      Peak Flow --      Pain Score 05/10/22 1547 2     Pain Loc --      Pain Edu? --      Excl. in [place]? --     Most recent vital signs: Vitals:   05/10/22 1700 05/10/22 1900  BP: 137/77 (!) 161/89  Pulse: (!) 49 (!) 50  Resp:  16  Temp:    SpO2: 98% 98%     General: Awake, no distress.  CV:  Good peripheral perfusion.  Regular rate and rhythm. Resp:  Normal work of breathing.  Lungs clear. Abd:  No distention.  No tenderness.  No rebound guarding. Other:  Awake, alert, no meningismus.  Well-appearing.   ED Results / Procedures / Treatments   Labs (all labs ordered are listed, but only abnormal  results are displayed) Labs Reviewed  COMPREHENSIVE METABOLIC PANEL - Abnormal; Notable for the following components:      Result Value   Glucose, Bld 132 (*)    BUN 23 (*)    Calcium 8.6 (*)    All other components within normal limits  LACTIC ACID, PLASMA - Abnormal; Notable for the following components:   Lactic Acid, Venous 2.6 (*)    All other components within normal limits  URINALYSIS, ROUTINE W REFLEX MICROSCOPIC - Abnormal; Notable for the following components:   Color, Urine YELLOW (*)    APPearance CLEAR (*)    Glucose, UA >=500 (*)    All other components within normal limits  CULTURE, BLOOD (ROUTINE X 2)  CULTURE, BLOOD (ROUTINE X 2)  CBC WITH DIFFERENTIAL/PLATELET  PROCALCITONIN  LACTIC ACID, PLASMA     EKG    RADIOLOGY    I also independently reviewed and agree with radiologist interpretations.   PROCEDURES:  Critical Care performed: No  MEDICATIONS ORDERED IN ED: Medications  sodium chloride 0.9 % bolus 1,000 mL (0 mLs Intravenous Stopped 05/10/22 1839)  LORazepam (ATIVAN) injection 1 mg (1 mg Intravenous Given 05/10/22 1642)     IMPRESSION / MDM / ASSESSMENT AND PLAN / ED COURSE  I reviewed the triage vital signs and the nursing notes.                              Differential diagnosis includes, but is not limited to, sepsis, contaminant, aspiration, other occult infection.  Patient's presentation is most consistent with acute presentation with potential threat to life or bodily function.  The patient is on the cardiac monitor to evaluate for evidence of arrhythmia and/or significant heart rate changes   Very well-appearing male here with possible positive blood culture from ED visit yesterday.  Clinically, the patient appears very well-appearing and states he was completely asymptomatic and felt much better.  He does report some mild tremor because he is currently stopping drinking, but does not appear to be severely withdrawing on assessment.   This improved with Ativan.  Otherwise, his lactic acid is 2.6.  I reviewed his previous records and the patient has an extensive alcohol history and chronic lactic acid elevation.  Do not suspect this is a sign of sepsis or hypoperfusion.  I also reviewed his procalcitonin which was negative and white count remains normal.  He has not completely normal vital signs.  I discussed the case with infectious disease, Dr. Tama High, who reviewed the culture.  Interestingly, the patient has a positive rod on culture but + non-MRSA staph on culture. She suspect this is contaminant. Cultures re-sent. She does not feel this needs IV ABX and pt is otherwise well appearing, would lilke to return home. Discussed additional return precautions with him and will d/c.  FINAL CLINICAL IMPRESSION(S) / ED DIAGNOSES   Final diagnoses:  None     Rx / DC Orders   ED Discharge Orders          Ordered    chlordiazePOXIDE (LIBRIUM) 25 MG capsule  Multiple Frequencies        05/10/22 1855             Note:  This document was prepared using Dragon voice recognition software and may include unintentional dictation errors.   Duffy Bruce, MD 05/10/22 Judith Blonder    Duffy Bruce, MD 05/10/22 [phone]

## 2022-05-10 NOTE — ED Triage Notes (Signed)
Pt to ED from home for abnormal labs. He received a call to come back for IV antibiotics due to abnormal blood cultures. Pt is caox4 and in no acute distress and walked to his room for triage.

## 2022-05-13 LAB — CULTURE, BLOOD (ROUTINE X 2)
Special Requests: ADEQUATE
Special Requests: ADEQUATE

## 2022-05-15 LAB — CULTURE, BLOOD (ROUTINE X 2)
Culture: NO GROWTH
Culture: NO GROWTH
Special Requests: ADEQUATE

## 2022-05-29 ENCOUNTER — Encounter: Payer: Self-pay | Admitting: Nurse Practitioner

## 2022-05-29 ENCOUNTER — Ambulatory Visit (INDEPENDENT_AMBULATORY_CARE_PROVIDER_SITE_OTHER): Payer: Medicaid Other | Admitting: Nurse Practitioner

## 2022-05-29 VITALS — BP 138/81 | HR 92 | Temp 97.6°F | Ht 72.0 in | Wt 219.8 lb

## 2022-05-29 DIAGNOSIS — F1010 Alcohol abuse, uncomplicated: Secondary | ICD-10-CM | POA: Diagnosis not present

## 2022-05-29 DIAGNOSIS — F419 Anxiety disorder, unspecified: Secondary | ICD-10-CM | POA: Diagnosis not present

## 2022-05-29 DIAGNOSIS — Z1211 Encounter for screening for malignant neoplasm of colon: Secondary | ICD-10-CM

## 2022-05-29 DIAGNOSIS — Z131 Encounter for screening for diabetes mellitus: Secondary | ICD-10-CM | POA: Insufficient documentation

## 2022-05-29 DIAGNOSIS — F32A Depression, unspecified: Secondary | ICD-10-CM

## 2022-05-29 LAB — POCT GLYCOSYLATED HEMOGLOBIN (HGB A1C): Hemoglobin A1C: 6.1 % — AB (ref 4.0–5.6)

## 2022-05-29 MED ORDER — BUSPIRONE HCL 10 MG PO TABS: 10.0000 mg | ORAL_TABLET | Freq: Two times a day (BID) | ORAL | 2 refills | Status: AC
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
## 2022-05-29 NOTE — Patient Instructions (Addendum)
1. Diabetes mellitus screening  - POCT glycosylated hemoglobin (Hb A1C)  2. Colon cancer screening  - Cologuard  3. Anxiety  - busPIRone (BUSPAR) 10 MG tablet; Take 1 tablet (10 mg total) by mouth 2 (two) times daily.  Dispense: 60 tablet; Refill: 2   4. Alcohol abuse  - will have consult with Manuela Schwartz - will need psychiatry for bipolar - will need detox program  Follow up:  Follow up in 1 month

## 2022-05-29 NOTE — Assessment & Plan Note (Signed)
-   POCT glycosylated hemoglobin (Hb A1C)  2. Colon cancer screening  - Cologuard  3. Anxiety  - busPIRone (BUSPAR) 10 MG tablet; Take 1 tablet (10 mg total) by mouth 2 (two) times daily.  Dispense: 60 tablet; Refill: 2   4. Alcohol abuse  - will have consult with Manuela Schwartz - will need psychiatry for bipolar - will need detox program  Follow up:  Follow up in 1 month

## 2022-05-30 ENCOUNTER — Ambulatory Visit (INDEPENDENT_AMBULATORY_CARE_PROVIDER_SITE_OTHER): Payer: Medicaid Other | Admitting: Clinical

## 2022-05-30 DIAGNOSIS — F1010 Alcohol abuse, uncomplicated: Secondary | ICD-10-CM

## 2022-05-30 NOTE — Progress Notes (Signed)
Integrated Behavioral Health Referral Note  05/30/2022 Name: Nicholas Chung MRN: [id number] DOB: 1965-07-25 Nicholas Chung is a 57 y.o. year old male who sees Fenton Foy, NP for primary care. LCSW was consulted to assess patient's needs and assist the patient with Mental Health Counseling and Resources.  Interpreter: No.   Interpreter Name & Language: none  Assessment: Patient experiencing Mental Health Concerns  and Substance abuse issues -  alcohol and cocaine .   Intervention: CSW called patient to follow up on visit with PCP yesterday. Patient was referred for substance abuse and mental health concerns. Brief assessment today via phone. Patient reports using alcohol, though not daily at this point; he last drank a few days ago. When he does drink, he finds it hard to stop at 1 or 2 drinks, and occasionally he will use cocaine when drinking. He reports a bipolar diagnosis from about 10 years ago.   Patient was just offered a new job a few hours before today's call, and is looking forward to starting, as he feels it will be beneficial to have his time occupied and to be earning good money. He is interested in individual mental health counseling, feels he may not be able to commit to intensive outpatient (IOP) at the moment due to his new job. He is also interested in [place]. He would like to have someone to call and check in when he's feeling the urge to drink.   Shared information on therapists in network with patient's insurance, [facility] meetings in his area, and crisis line information. Also planned to meet with patient in the interim until connected with a long term therapist. Patient to call to schedule with CSW once he gets his new work schedule. Will plan to assist patient in connecting with a longer term therapist and with engaging in [place].  SDOH (Social Determinants of Health) assessments performed: No Patient reported he has stable housing and just got a new job that he will start  soon.  Review of patient status, including review of consultants reports, relevant laboratory and other test results, and collaboration with appropriate care team members and the patient's provider was performed as part of comprehensive patient evaluation and provision of services.    [name], [facility] [phone]

## 2022-06-01 ENCOUNTER — Emergency Department
Admission: EM | Admit: 2022-06-01 | Discharge: 2022-06-02 | Disposition: A | Discharge status: Home / Self Care | Payer: Medicaid Other | Attending: Emergency Medicine | Admitting: Emergency Medicine

## 2022-06-01 ENCOUNTER — Other Ambulatory Visit: Payer: Self-pay

## 2022-06-01 DIAGNOSIS — J449 Chronic obstructive pulmonary disease, unspecified: Secondary | ICD-10-CM | POA: Insufficient documentation

## 2022-06-01 DIAGNOSIS — I5042 Chronic combined systolic (congestive) and diastolic (congestive) heart failure: Secondary | ICD-10-CM | POA: Insufficient documentation

## 2022-06-01 DIAGNOSIS — L299 Pruritus, unspecified: Secondary | ICD-10-CM | POA: Diagnosis not present

## 2022-06-01 DIAGNOSIS — T782XXA Anaphylactic shock, unspecified, initial encounter: Secondary | ICD-10-CM

## 2022-06-01 DIAGNOSIS — Z8551 Personal history of malignant neoplasm of bladder: Secondary | ICD-10-CM | POA: Diagnosis not present

## 2022-06-01 DIAGNOSIS — I110 Hypertensive heart disease with heart failure: Secondary | ICD-10-CM | POA: Diagnosis not present

## 2022-06-01 DIAGNOSIS — I10 Essential (primary) hypertension: Secondary | ICD-10-CM | POA: Diagnosis not present

## 2022-06-01 MED ORDER — EPINEPHRINE 0.3 MG/0.3ML IJ SOAJ
INTRAMUSCULAR | Status: AC
  Administered 2022-06-01: 0.3 mg via INTRAMUSCULAR
  Filled 2022-06-01: qty 0.3

## 2022-06-01 MED ORDER — EPINEPHRINE 0.3 MG/0.3ML IJ SOAJ: 0.3000 mg | Freq: Once | INTRAMUSCULAR | Status: AC

## 2022-06-01 MED ORDER — METHYLPREDNISOLONE SODIUM SUCC 125 MG IJ SOLR
125.0000 mg | Freq: Once | INTRAMUSCULAR | Status: AC
  Administered 2022-06-01: 125 mg via INTRAVENOUS
  Filled 2022-06-01: qty 2

## 2022-06-01 MED ORDER — DIPHENHYDRAMINE HCL 50 MG/ML IJ SOLN
INTRAMUSCULAR | Status: AC
  Administered 2022-06-01: 50 mg via INTRAVENOUS
  Filled 2022-06-01: qty 1

## 2022-06-01 MED ORDER — DIPHENHYDRAMINE HCL 50 MG/ML IJ SOLN: 50.0000 mg | Freq: Once | INTRAMUSCULAR | Status: AC

## 2022-06-01 MED ORDER — FAMOTIDINE IN NACL 20-0.9 MG/50ML-% IV SOLN
INTRAVENOUS | Status: AC
  Filled 2022-06-01: qty 50

## 2022-06-01 MED ORDER — SODIUM CHLORIDE 0.9 % IV SOLN
40.0000 mg | Freq: Once | INTRAVENOUS | Status: AC
  Administered 2022-06-01: 40 mg via INTRAVENOUS

## 2022-06-01 MED ORDER — EPINEPHRINE 0.3 MG/0.3ML IJ SOAJ
0.3000 mg | Freq: Once | INTRAMUSCULAR | Status: AC
  Administered 2022-06-01: 0.3 mg via INTRAMUSCULAR
  Filled 2022-06-01: qty 0.3

## 2022-06-01 NOTE — ED Notes (Signed)
Pts wife stopped this EDT. Wife wanted this EDT to write her phone number down so she could be contacted in the future about the pt. EDT wrote down exactly what pts wife told EDT to write down. Her contact info is as folloows:  Tymari Bek  [phone]

## 2022-06-01 NOTE — ED Triage Notes (Addendum)
Pt to ED from home for allergic reaction. Pt tongue is swollen, face is swollen,  having trouble swallowing, knot in throat. Pt moving good air, pink warm and dry, pt is itching all over. Pt has hives in belt line. Pt placed in wheel chair and moved to room 3.  Pt face is very red and flushed in color.  Pt is unknown what he may have been expose too. He was working out in the yard earlier, then had some meatballs for dinner. This has never happened before.

## 2022-06-02 MED ORDER — FAMOTIDINE 20 MG PO TABS: 20.0000 mg | ORAL_TABLET | Freq: Two times a day (BID) | ORAL | 0 refills | Status: DC

## 2022-06-02 MED ORDER — EPINEPHRINE 0.3 MG/0.3ML IJ SOAJ: 0.3000 mg | INTRAMUSCULAR | 3 refills | Status: AC | PRN

## 2022-06-02 MED ORDER — PREDNISONE 20 MG PO TABS: ORAL_TABLET | ORAL | 0 refills | Status: DC

## 2022-06-02 NOTE — ED Provider Notes (Signed)
-----------------------------------------   12:22 AM on 06/02/2022 -----------------------------------------   Care assumed from Dr. Starleen Blue.  In summary, this is a 57 year old male presenting for angioedema.  Received 2 IM epi in addition to allergic reaction cocktail.  Does not wish to stay for hospitalization.  Exam reveals clear posterior oropharynx, mildly swollen tongue.  Phonation normal.  There is no hoarse or muffled voice.  Tolerating secretions well.  No palpable neck mass.  Patient states he is overall feeling significantly better.  Will continue to monitor and care for patient.   ----------------------------------------- 2:17 AM on 06/02/2022 -----------------------------------------   Patient eager for discharge home.  Room air saturation 96%.  Swelling resolved.  Posterior oropharynx is clear.  Phonation normal.  Patient states he is back to baseline.  Will discharge home on EpiPen (prescription already sent to patient's pharmacy by previous provider), prednisone and Pepcid for total of 5-day burst.  Strict return precautions given.  Patient verbalizes understanding and agrees with plan of care.   Paulette Blanch, MD 06/02/22 [phone]
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
## 2022-06-02 NOTE — Discharge Instructions (Addendum)
Please STOP taking the losartan, as there is a possibility that the tongue swelling is a reaction to this medication. Do not take medications in the ACE or ARB category.  You received the following medications in the ER tonight: EpiPen Pepcid Solu-Medrol Benadryl  Take Prednisone and Pepcid as instructed for the next 4 days.  If you develop worsening symptoms please administer the EpiPen.  Turn to the ER for worsening symptoms, persistent vomiting, difficulty breathing or other concerns.

## 2022-06-02 NOTE — ED Notes (Signed)
Pt is resting comfortably at this time.  Appears to not be in any distress in comparison to prior his second epi shot--pt was constantly clearing his throat, c/o some mild swelling still and now pt is resting, no throat clearing and has no complaints

## 2022-06-02 NOTE — ED Notes (Signed)
Pt cont to have no complaints and states that he feels much better. Able to swallow and talk without issue

## 2022-06-23 ENCOUNTER — Other Ambulatory Visit: Payer: Self-pay

## 2022-06-23 MED ORDER — FAMOTIDINE 20 MG PO TABS: 20.0000 mg | ORAL_TABLET | Freq: Two times a day (BID) | ORAL | 0 refills | Status: DC

## 2022-06-23 NOTE — Telephone Encounter (Signed)
Please advise if you want him on more than 8 tablets . KH

## 2022-06-27 ENCOUNTER — Ambulatory Visit: Payer: Medicaid Other | Admitting: Nurse Practitioner

## 2022-07-18 ENCOUNTER — Ambulatory Visit
Admission: EM | Admit: 2022-07-18 | Discharge: 2022-07-18 | Disposition: A | Discharge status: Home / Self Care | Payer: Medicaid Other | Attending: Emergency Medicine | Admitting: Emergency Medicine

## 2022-07-18 ENCOUNTER — Other Ambulatory Visit: Payer: Self-pay

## 2022-07-18 ENCOUNTER — Emergency Department
Admission: EM | Admit: 2022-07-18 | Discharge: 2022-07-18 | Disposition: A | Discharge status: Home / Self Care | Payer: Medicaid Other | Attending: Emergency Medicine | Admitting: Emergency Medicine

## 2022-07-18 ENCOUNTER — Encounter: Payer: Self-pay | Admitting: Emergency Medicine

## 2022-07-18 DIAGNOSIS — R9431 Abnormal electrocardiogram [ECG] [EKG]: Secondary | ICD-10-CM

## 2022-07-18 DIAGNOSIS — I10 Essential (primary) hypertension: Secondary | ICD-10-CM

## 2022-07-18 DIAGNOSIS — J449 Chronic obstructive pulmonary disease, unspecified: Secondary | ICD-10-CM | POA: Insufficient documentation

## 2022-07-18 DIAGNOSIS — R197 Diarrhea, unspecified: Secondary | ICD-10-CM | POA: Insufficient documentation

## 2022-07-18 DIAGNOSIS — R42 Dizziness and giddiness: Secondary | ICD-10-CM | POA: Diagnosis not present

## 2022-07-18 DIAGNOSIS — I509 Heart failure, unspecified: Secondary | ICD-10-CM | POA: Insufficient documentation

## 2022-07-18 DIAGNOSIS — R112 Nausea with vomiting, unspecified: Secondary | ICD-10-CM | POA: Insufficient documentation

## 2022-07-18 DIAGNOSIS — R1084 Generalized abdominal pain: Secondary | ICD-10-CM

## 2022-07-18 DIAGNOSIS — R200 Anesthesia of skin: Secondary | ICD-10-CM

## 2022-07-18 LAB — COMPREHENSIVE METABOLIC PANEL
ALT: 27 U/L (ref 0–44)
AST: 36 U/L (ref 15–41)
Albumin: 3.7 g/dL (ref 3.5–5.0)
Alkaline Phosphatase: 61 U/L (ref 38–126)
Anion gap: 7 (ref 5–15)
BUN: 24 mg/dL — ABNORMAL HIGH (ref 6–20)
CO2: 23 mmol/L (ref 22–32)
Calcium: 8.7 mg/dL — ABNORMAL LOW (ref 8.9–10.3)
Chloride: 110 mmol/L (ref 98–111)
Creatinine, Ser: 0.84 mg/dL (ref 0.61–1.24)
GFR, Estimated: >60 mL/min (ref >60)
Glucose, Bld: 114 mg/dL — ABNORMAL HIGH (ref 70–99)
Potassium: 4.9 mmol/L (ref 3.5–5.1)
Sodium: 140 mmol/L (ref 135–145)
Total Bilirubin: 0.8 mg/dL (ref 0.3–1.2)
Total Protein: 6.5 g/dL (ref 6.5–8.1)

## 2022-07-18 LAB — CBC
HCT: 46.8 % (ref 39.0–52.0)
Hemoglobin: 15.1 g/dL (ref 13.0–17.0)
MCH: 31 pg (ref 26.0–34.0)
MCHC: 32.3 g/dL (ref 30.0–36.0)
MCV: 96.1 fL (ref 80.0–100.0)
Platelets: 236 10*3/uL (ref 150–400)
RBC: 4.87 MIL/uL (ref 4.22–5.81)
RDW: 12.8 % (ref 11.5–15.5)
WBC: 7.5 10*3/uL (ref 4.0–10.5)
nRBC: 0 % (ref 0.0–0.2)

## 2022-07-18 LAB — LIPASE, BLOOD: Lipase: 45 U/L (ref 11–51)

## 2022-07-18 MED ORDER — ONDANSETRON 8 MG PO TBDP: 8.0000 mg | ORAL_TABLET | Freq: Three times a day (TID) | ORAL | 0 refills | Status: DC | PRN

## 2022-07-18 MED ORDER — LOPERAMIDE HCL 2 MG PO CAPS: 2.0000 mg | ORAL_CAPSULE | ORAL | 0 refills | Status: DC | PRN
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
## 2022-07-18 NOTE — Discharge Instructions (Signed)
Go to the emergency department

## 2022-07-18 NOTE — ED Provider Notes (Signed)
[facility] Provider Note   Event Date/Time   First MD Initiated Contact with Patient 07/18/22 [phone]     (approximate) History  Diarrhea and Dizziness  HPI Nicholas Chung is a 57 y.o. male with a past medical history of COPD, heart failure, and alcohol abuse who presents complaining of diarrhea, nausea, and vomiting over the last 2 days.  Patient was seen in urgent care this morning and was sent to the emergency department for EKG changes.  Patient denies any active chest pain at this time.  Patient denies any recent travel or sick contacts.  Patient endorses mild left lower quadrant abdominal pain that is resolved with defecation.  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  Patient states that he does wear gloves but has had issue with his fingers turning white during work as well.  Patient does endorse continued tobacco abuse ROS: Patient currently denies any vision changes, tinnitus, difficulty speaking, facial droop, sore throat, chest pain, shortness of breath, dysuria, or weakness/paresthesias in any extremity   Physical Exam  Triage Vital Signs: ED Triage Vitals  Enc Vitals Group     BP 07/18/22 0902 (!) 194/98     Pulse Rate 07/18/22 0902 67     Resp 07/18/22 0902 16     Temp 07/18/22 0902 98.5 F (36.9 C)     Temp Source 07/18/22 0902 Oral     SpO2 07/18/22 0902 95 %     Weight 07/18/22 0902 225 lb (102.1 kg)     Height 07/18/22 0902 6' (1.829 m)     Head Circumference --      Peak Flow --      Pain Score 07/18/22 0911 0     Pain Loc --      Pain Edu? --      Excl. in GC? --    Most recent vital signs: Vitals:   07/18/22 0930 07/18/22 1000  BP: (!) 169/91 (!) 147/87  Pulse: (!) 56 (!) 54  Resp: (!) 25 13  Temp:    SpO2: 97% 96%   General: Awake, oriented x4. CV:  Good peripheral perfusion.  Resp:  Normal effort.  Abd:  No distention.   Other:  Middle-aged overweight Caucasian male laying in bed in no acute distress.  Subjective numbness to bilateral fingertips ED Results / Procedures / Treatments  Labs (all labs ordered are listed, but only abnormal results are displayed) Labs Reviewed  COMPREHENSIVE METABOLIC PANEL - Abnormal; Notable for the following components:      Result Value   Glucose, Bld 114 (*)    BUN 24 (*)    Calcium 8.7 (*)    All other components within normal limits  LIPASE, BLOOD  CBC   EKG ED ECG REPORT I, Merwyn Katos, the attending physician, personally viewed and interpreted this ECG. Date: 07/18/2022 EKG Time: 0921 Rate: 56 Rhythm: normal sinus rhythm QRS Axis: normal Intervals: normal ST/T Wave abnormalities: normal Narrative Interpretation: no evidence of acute ischemia  PROCEDURES: Critical Care performed: No Procedures MEDICATIONS ORDERED IN ED: Medications - No data to display IMPRESSION / MDM / ASSESSMENT AND PLAN / ED COURSE  I reviewed the triage vital signs and the nursing notes.                             The patient  is on the cardiac monitor to evaluate for evidence of arrhythmia and/or significant heart rate changes. Patient's presentation is most consistent with acute presentation with potential threat to life or bodily function. Patient presents for acute nausea/vomiting The cause of the patients symptoms is not clear, but the patient is overall well appearing and is suspected to have a transient course of illness.  Given History and Exam there does not appear to be an emergent cause of the symptoms such as small bowel obstruction, coronary syndrome, bowel ischemia, DKA, pancreatitis, appendicitis, other acute abdomen or other emergent problem.  Reassessment: After treatment, the patient is feeling much better, tolerating PO fluids, and shows no signs of dehydration.   Disposition: Discharge home with prompt primary care physician follow up in the next 48 hours.  Strict return precautions discussed.   FINAL CLINICAL IMPRESSION(S) / ED DIAGNOSES   Final diagnoses:  Nausea, vomiting, and diarrhea   Rx / DC Orders   ED Discharge Orders          Ordered    ondansetron (ZOFRAN-ODT) 8 MG disintegrating tablet  Every 8 hours PRN        07/18/22 1025    loperamide (IMODIUM) 2 MG capsule  As needed        07/18/22 1025           Note:  This document was prepared using Dragon voice recognition software and may include unintentional dictation errors.   Merwyn Katos, MD 07/18/22 [phone]

## 2022-07-18 NOTE — ED Triage Notes (Signed)
Pt presents to ED with c/o of bloating and diarrhea since Wednesday night. 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 Pt is A&Ox4. NAD noted.

## 2022-07-18 NOTE — ED Notes (Signed)
Pt discharge to home. Pt VSS, GCS 15, NAD. Pt verbalized understanding of discharge instructions with no additional questions at this time.  

## 2022-07-18 NOTE — ED Triage Notes (Signed)
Patient in office today c/o vomiting and diarrhea x 1d. Per patient loose stools 4 times. Stated no pain: works in Animal nutritionist at [employer] numb not sure if this is something to do with the issue he is here for.  ZOX:WRUE  Denies: fever

## 2022-10-17 ENCOUNTER — Other Ambulatory Visit: Payer: Self-pay

## 2022-10-17 ENCOUNTER — Other Ambulatory Visit: Payer: Self-pay | Admitting: Nurse Practitioner

## 2022-10-17 DIAGNOSIS — I5042 Chronic combined systolic (congestive) and diastolic (congestive) heart failure: Secondary | ICD-10-CM

## 2022-12-22 ENCOUNTER — Other Ambulatory Visit: Payer: Self-pay

## 2022-12-22 MED ORDER — CARVEDILOL 25 MG PO TABS: 25.0000 mg | ORAL_TABLET | Freq: Two times a day (BID) | ORAL | 0 refills | Status: DC

## 2022-12-22 MED ORDER — SPIRONOLACTONE 25 MG PO TABS: 25.0000 mg | ORAL_TABLET | Freq: Every day | ORAL | 0 refills | Status: DC

## 2022-12-24 ENCOUNTER — Other Ambulatory Visit: Payer: Self-pay | Admitting: Nurse Practitioner

## 2022-12-24 MED ORDER — CARVEDILOL 25 MG PO TABS: 25.0000 mg | ORAL_TABLET | Freq: Two times a day (BID) | ORAL | 0 refills | Status: DC

## 2022-12-24 MED ORDER — SPIRONOLACTONE 25 MG PO TABS: 25.0000 mg | ORAL_TABLET | Freq: Every day | ORAL | 0 refills | Status: DC

## 2023-02-17 ENCOUNTER — Other Ambulatory Visit: Payer: Self-pay | Admitting: Nurse Practitioner

## 2023-03-23 ENCOUNTER — Other Ambulatory Visit: Payer: Self-pay | Admitting: Nurse Practitioner

## 2023-03-24 ENCOUNTER — Telehealth: Payer: Self-pay

## 2023-03-24 ENCOUNTER — Other Ambulatory Visit: Payer: Self-pay

## 2023-03-24 ENCOUNTER — Other Ambulatory Visit: Payer: Self-pay | Admitting: Family

## 2023-03-24 NOTE — Telephone Encounter (Signed)
Pharmacy Patient Advocate Encounter  Received notification from [facility] Medicaid that Prior Authorization for JARDIANCE has been APPROVED from 03/10/2023 to 03/23/2024   PA #/Case ID/Reference #: [id number]

## 2023-04-03 ENCOUNTER — Emergency Department
Admission: EM | Admit: 2023-04-03 | Discharge: 2023-04-03 | Discharge status: Against Medical Advice | Payer: Medicaid Other | Source: Home / Self Care

## 2023-04-03 ENCOUNTER — Emergency Department (HOSPITAL_COMMUNITY)
Admission: EM | Admit: 2023-04-03 | Discharge: 2023-04-03 | Discharge status: Against Medical Advice | Payer: Medicaid Other

## 2023-04-03 NOTE — ED Triage Notes (Signed)
 Called x 3 no answer

## 2023-04-03 NOTE — ED Triage Notes (Signed)
 Called, no answer.

## 2023-04-03 NOTE — ED Triage Notes (Signed)
 Called x 2 no answer

## 2023-04-06 ENCOUNTER — Telehealth: Payer: Self-pay

## 2023-04-06 NOTE — Transitions of Care (Post Inpatient/ED Visit) (Unsigned)
   04/06/2023  Name: Nicholas Chung MRN: [id number] DOB: Mar 09, 1965  Today's TOC FU Call Status: Today's TOC FU Call Status:: Unsuccessful Call (1st Attempt) Unsuccessful Call (1st Attempt) Date: 04/06/23  Attempted to reach the patient regarding the most recent Inpatient/ED visit.  Follow Up Plan: Additional outreach attempts will be made to reach the patient to complete the Transitions of Care (Post Inpatient/ED visit) call.   Patient stated that he wasn't ever admitted and he doesn't feel like talking right now and hung up the phone-[employer], CMA

## 2023-04-07 ENCOUNTER — Telehealth: Payer: Self-pay

## 2023-04-07 NOTE — Transitions of Care (Post Inpatient/ED Visit) (Signed)
   04/07/2023  Name: Nicholas Chung MRN: [id number] DOB: Aug 13, 1965  Today's TOC FU Call Status: Today's TOC FU Call Status:: Unsuccessful Call (2nd Attempt) Unsuccessful Call (2nd Attempt) Date: 04/07/23  Attempted to reach the patient regarding the most recent Inpatient/ED visit.  Follow Up Plan: Additional outreach attempts will be made to reach the patient to complete the Transitions of Care (Post Inpatient/ED visit) call.   Signature  [employer], CMA

## 2023-04-08 ENCOUNTER — Telehealth: Payer: Self-pay

## 2023-04-08 NOTE — Transitions of Care (Post Inpatient/ED Visit) (Signed)
   04/08/2023  Name: Nicholas Chung MRN: [id number] DOB: 1965-03-26  Today's TOC FU Call Status: Today's TOC FU Call Status:: Unsuccessful Call (3rd Attempt) Unsuccessful Call (3rd Attempt) Date: 04/08/23  Attempted to reach the patient regarding the most recent Inpatient/ED visit.  Follow Up Plan: No further outreach attempts will be made at this time. We have been unable to contact the patient.  Signature  [employer], CMA

## 2023-04-27 ENCOUNTER — Other Ambulatory Visit: Payer: Self-pay | Admitting: Nurse Practitioner

## 2023-05-03 ENCOUNTER — Other Ambulatory Visit: Payer: Self-pay | Admitting: Nurse Practitioner

## 2023-05-04 ENCOUNTER — Other Ambulatory Visit: Payer: Self-pay | Admitting: Nurse Practitioner

## 2023-05-04 NOTE — Telephone Encounter (Signed)
 Copied from CRM [id number]. Topic: Clinical - Medication Refill >> May 04, 2023  5:18 PM Santiya F wrote: Most Recent Primary Care Visit:  Provider: Abigail Butts  Department: [facility]  Visit Type: TELEPHONE OFFICE VISIT  Date: 05/30/2022  Medication: atorvastatin (LIPITOR) 40 MG tablet [[id number]]  Has the patient contacted their pharmacy? Yes  (Agent: If yes, when and what did the pharmacy advise?) Contact office  Is this the correct pharmacy for this prescription? Yes  This is the patient's preferred pharmacy:  [facility] [address] [place] - [address] [place] [address] Phone: [phone] Fax: [phone]   Has the prescription been filled recently? No  Is the patient out of the medication? Yes  Has the patient been seen for an appointment in the last year OR does the patient have an upcoming appointment? Yes  Can we respond through MyChart? Yes  Agent: Please be advised that Rx refills may take up to 3 business days. We ask that you follow-up with your pharmacy.

## 2023-05-04 NOTE — Telephone Encounter (Signed)
 Last Fill: 02/27/22  Last OV: 05/29/22 Next OV: 06/05/23  Routing to provider for review/authorization.

## 2023-05-05 MED ORDER — ATORVASTATIN CALCIUM 40 MG PO TABS: 40.0000 mg | ORAL_TABLET | Freq: Every day | ORAL | 5 refills | Status: DC

## 2023-05-07 ENCOUNTER — Other Ambulatory Visit: Payer: Self-pay

## 2023-05-07 ENCOUNTER — Emergency Department
Admission: EM | Admit: 2023-05-07 | Discharge: 2023-05-07 | Disposition: A | Discharge status: Home / Self Care | Attending: Emergency Medicine | Admitting: Emergency Medicine

## 2023-05-07 ENCOUNTER — Emergency Department

## 2023-05-07 DIAGNOSIS — R0789 Other chest pain: Secondary | ICD-10-CM | POA: Diagnosis not present

## 2023-05-07 DIAGNOSIS — F419 Anxiety disorder, unspecified: Secondary | ICD-10-CM | POA: Diagnosis not present

## 2023-05-07 DIAGNOSIS — I10 Essential (primary) hypertension: Secondary | ICD-10-CM | POA: Insufficient documentation

## 2023-05-07 DIAGNOSIS — R079 Chest pain, unspecified: Secondary | ICD-10-CM | POA: Diagnosis present

## 2023-05-07 LAB — CBC
HCT: 49.2 % (ref 39.0–52.0)
Hemoglobin: 16.5 g/dL (ref 13.0–17.0)
MCH: 30.7 pg (ref 26.0–34.0)
MCHC: 33.5 g/dL (ref 30.0–36.0)
MCV: 91.4 fL (ref 80.0–100.0)
Platelets: 319 10*3/uL (ref 150–400)
RBC: 5.38 MIL/uL (ref 4.22–5.81)
RDW: 12.6 % (ref 11.5–15.5)
WBC: 8 10*3/uL (ref 4.0–10.5)
nRBC: 0 % (ref 0.0–0.2)

## 2023-05-07 LAB — BASIC METABOLIC PANEL
Anion gap: 10 (ref 5–15)
BUN: 27 mg/dL — ABNORMAL HIGH (ref 6–20)
CO2: 23 mmol/L (ref 22–32)
Calcium: 9.9 mg/dL (ref 8.9–10.3)
Chloride: 106 mmol/L (ref 98–111)
Creatinine, Ser: 0.93 mg/dL (ref 0.61–1.24)
GFR, Estimated: >60 mL/min (ref >60)
Glucose, Bld: 132 mg/dL — ABNORMAL HIGH (ref 70–99)
Potassium: 4.7 mmol/L (ref 3.5–5.1)
Sodium: 139 mmol/L (ref 135–145)

## 2023-05-07 LAB — TROPONIN I (HIGH SENSITIVITY)
Troponin I (High Sensitivity): 13 ng/L (ref <18)
Troponin I (High Sensitivity): 13 ng/L (ref <18)

## 2023-05-07 MED ORDER — HYDROXYZINE PAMOATE 25 MG PO CAPS: 25.0000 mg | ORAL_CAPSULE | Freq: Three times a day (TID) | ORAL | 0 refills | Status: DC | PRN

## 2023-05-07 MED ORDER — ACETAMINOPHEN 500 MG PO TABS
1000.0000 mg | ORAL_TABLET | Freq: Once | ORAL | Status: AC
  Administered 2023-05-07: 1000 mg via ORAL
  Filled 2023-05-07: qty 2

## 2023-05-07 NOTE — ED Provider Notes (Signed)
 [facility] Provider Note    Event Date/Time   First MD Initiated Contact with Patient 05/07/23 1820     (approximate)   History   Hypertension and Chest Pain   HPI  Nicholas Chung is a 58 y.o. male with history of hypertension who reports being under a lot of stress lately and noticing that his blood pressure is elevated when he gets upset.  He reports arguing with a lawyer today about a settlement and developing some chest pain.  Patient reports that this happened around 2 to 3 PM.  He states that his chest pain is since all resolved.  Does report a little bit of pressure behind his eyes but he denies it being a severe headache.  Patient is okay getting some Tylenol.  He states that this headache is very typical for him and denies it being the worst headache of his life.  Not sudden onset.  He states that this sometimes happens when his blood pressure gets elevated or when he feels anxious.  He does report a lot of anxiety recently and that he is trying to get into his primary care doctor to get started on anxiety medications.  He states that when he has an event that builds him up, then patient has a hard time calming down and then he develops the chest pain.  He reports resolution of chest pain no shortness of breath no risk factors for PE.    Physical Exam   Triage Vital Signs: ED Triage Vitals  Encounter Vitals Group     BP 05/07/23 1644 (!) 147/101     Systolic BP Percentile --      Diastolic BP Percentile --      Pulse Rate 05/07/23 1641 61     Resp 05/07/23 1641 20     Temp 05/07/23 1641 98.2 F (36.8 C)     Temp src --      SpO2 05/07/23 1641 100 %     Weight 05/07/23 1642 225 lb (102.1 kg)     Height 05/07/23 1642 6' (1.829 m)     Head Circumference --      Peak Flow --      Pain Score 05/07/23 1642 4     Pain Loc --      Pain Education --      Exclude from Growth Chart --     Most recent vital signs: Vitals:   05/07/23 1641 05/07/23  1644  BP:  (!) 147/101  Pulse: 61   Resp: 20   Temp: 98.2 F (36.8 C)   SpO2: 100%      General: Awake, no distress.  CV:  Good peripheral perfusion.  Resp:  Normal effort.  Abd:  No distention.  Other:  No swelling in legs.  No calf tenderness.  Cranial nerves II through XII are intact.  Equal strength in arms and legs   ED Results / Procedures / Treatments   Labs (all labs ordered are listed, but only abnormal results are displayed) Labs Reviewed  BASIC METABOLIC PANEL - Abnormal; Notable for the following components:      Result Value   Glucose, Bld 132 (*)    BUN 27 (*)    All other components within normal limits  CBC  TROPONIN I (HIGH SENSITIVITY)  TROPONIN I (HIGH SENSITIVITY)     EKG  My interpretation of EKG:  Normal sinus rate of 62 without any ST elevation or T wave inversions  except for aVL, normal intervals  RADIOLOGY I have reviewed the xray personally and interpreted no evidence of any pneumonia   PROCEDURES:  Critical Care performed: No  Procedures   MEDICATIONS ORDERED IN ED: Medications - No data to display   IMPRESSION / MDM / ASSESSMENT AND PLAN / ED COURSE  I reviewed the triage vital signs and the nursing notes.   Patient's presentation is most consistent with acute presentation with potential threat to life or bodily function.     INITIAL IMPRESSION / ASSESSMENT AND PLAN / ED COURSE   Most Likely DDx:  -Consider ACS vs MSK Vs anxiety- will get EKG/troponin to evaluate for ACS       DDx that was also considered d/t potential to cause harm, but was found less likely based on history and physical (as detailed above): -PNA (no fevers, cough but CXR to evaluate) -PNX (reassured with equal b/l breath sounds, CXR to evaluate) -Symptomatic anemia (will get H&H) -Pulmonary embolism as no sob at rest, not pleuritic in nature, no hypoxia -Aortic Dissection as no tearing pain and no radiation to the mid back, pulses equal, no  murmur -Pericarditis no EKG changes or hx to suggest dx -Tamponade (no notable SOB, tachycardic, hypotensive) -Esophageal rupture (no h/o diffuse vomitting/no crepitus)     At this time given re-assuring workup I have considered CT imaging to rule out PE/Dissection but given history and physical exam these seem less likely and potential harm from CT would outweigh the probability of finding PE or Dissection.    I have considered admission for patient, but given re-assuring workup including EKG, troponin, and re-assuring vitals patient can follow up outpatient with cardiology.   Discussed with patient that I can not predict future heart attacks and that cardiology can evaluate for need for further workup including stress test.  Explained to patient that if there is a change in symptoms, worsening symptoms, or any other concerns that they should return for repeat evaluation to have repeat EKG/troponin. They expressed understanding.  Troponin is negative.  BMP shows slight elevation of BUN similar to previous.  CBC normal I suspect this is related to situational, anxiety.  We discussed trialing some hydroxyzine at home and following up with PCP to discuss SSRIs.  He expressed understanding and felt comfortable with this plan.  He did not want to stay for his repeat troponin but will leave his phone on him and would rather return if it is elevated.  He does have some elevated blood pressures but significantly downtrending now that he is calm down we discussed start another blood pressure agent but he would like to hold off at this time given his blood pressures run in the 130s systolic when he is not anxious    Note:  This document was prepared using Dragon voice recognition software and may include unintentional dictation errors.\      FINAL CLINICAL IMPRESSION(S) / ED DIAGNOSES   Final diagnoses:  Anxiety  Hypertension, unspecified type  Atypical chest pain     Rx / DC Orders   ED  Discharge Orders     None        Note:  This document was prepared using Dragon voice recognition software and may include unintentional dictation errors.   Concha Se, MD 05/07/23 [phone]

## 2023-05-07 NOTE — Discharge Instructions (Signed)
 We are starting you on some hydroxyzine in case this is related to your anxiety.  We have also discussed following up with your primary care doctor to discuss your blood pressure.  Return to the ER if you develop worsening symptoms or any other concerns   I can not predict future heart attacks and that cardiology can evaluate for need for further workup including stress test.  If there is a change in symptoms, worsening symptoms, or any other concerns that they should return for repeat evaluation to have repeat EKG/troponin.

## 2023-05-07 NOTE — ED Triage Notes (Signed)
 Pt to ED for continued HTN, reports took meds this am for it. Reports has been arguing with lawyers today about a settlement for work and began having chest pain. +nausea
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
## 2023-05-08 ENCOUNTER — Telehealth: Payer: Self-pay | Admitting: Nurse Practitioner

## 2023-05-08 ENCOUNTER — Telehealth: Payer: Self-pay

## 2023-05-08 NOTE — Transitions of Care (Post Inpatient/ED Visit) (Signed)
   05/08/2023  Name: Nicholas Chung MRN: [id number] DOB: 1965-04-20  Today's TOC FU Call Status:    Attempted to reach the patient regarding the most recent Inpatient/ED visit.  Follow Up Plan: Additional outreach attempts will be made to reach the patient to complete the Transitions of Care (Post Inpatient/ED visit) call.   Signature  [employer], [place]

## 2023-05-08 NOTE — Telephone Encounter (Signed)
Appointment was schedule.

## 2023-05-23 ENCOUNTER — Other Ambulatory Visit: Payer: Self-pay | Admitting: Nurse Practitioner

## 2023-05-28 ENCOUNTER — Other Ambulatory Visit: Payer: Self-pay

## 2023-05-28 ENCOUNTER — Other Ambulatory Visit: Payer: Self-pay | Admitting: Nurse Practitioner

## 2023-05-28 MED ORDER — CARVEDILOL 25 MG PO TABS: 25.0000 mg | ORAL_TABLET | Freq: Two times a day (BID) | ORAL | 0 refills | Status: DC

## 2023-05-28 MED ORDER — HYDROXYZINE PAMOATE 25 MG PO CAPS: 25.0000 mg | ORAL_CAPSULE | Freq: Three times a day (TID) | ORAL | 0 refills | Status: AC | PRN

## 2023-05-28 MED ORDER — SPIRONOLACTONE 25 MG PO TABS: 25.0000 mg | ORAL_TABLET | Freq: Every day | ORAL | 0 refills | Status: DC

## 2023-05-29 ENCOUNTER — Other Ambulatory Visit: Payer: Self-pay | Admitting: Family

## 2023-06-05 ENCOUNTER — Ambulatory Visit (INDEPENDENT_AMBULATORY_CARE_PROVIDER_SITE_OTHER): Payer: Self-pay | Admitting: Nurse Practitioner

## 2023-06-05 VITALS — BP 123/84 | HR 57 | Temp 97.9°F | Wt 226.4 lb

## 2023-06-05 DIAGNOSIS — I5042 Chronic combined systolic (congestive) and diastolic (congestive) heart failure: Secondary | ICD-10-CM | POA: Diagnosis not present

## 2023-06-05 DIAGNOSIS — Z13220 Encounter for screening for lipoid disorders: Secondary | ICD-10-CM

## 2023-06-05 DIAGNOSIS — R7301 Impaired fasting glucose: Secondary | ICD-10-CM

## 2023-06-05 DIAGNOSIS — Z1329 Encounter for screening for other suspected endocrine disorder: Secondary | ICD-10-CM

## 2023-06-05 MED ORDER — ASPIRIN 81 MG PO TBEC: 81.0000 mg | DELAYED_RELEASE_TABLET | Freq: Every day | ORAL | 0 refills | Status: AC

## 2023-06-05 MED ORDER — EMPAGLIFLOZIN 10 MG PO TABS: 10.0000 mg | ORAL_TABLET | Freq: Every day | ORAL | 0 refills | Status: DC

## 2023-06-05 MED ORDER — CARVEDILOL 25 MG PO TABS: 25.0000 mg | ORAL_TABLET | Freq: Two times a day (BID) | ORAL | 0 refills | Status: DC

## 2023-06-05 MED ORDER — ATORVASTATIN CALCIUM 40 MG PO TABS: 40.0000 mg | ORAL_TABLET | Freq: Every day | ORAL | 5 refills | Status: AC

## 2023-06-05 MED ORDER — SPIRONOLACTONE 25 MG PO TABS: 25.0000 mg | ORAL_TABLET | Freq: Every day | ORAL | 0 refills | Status: DC

## 2023-06-05 MED ORDER — LOSARTAN POTASSIUM 100 MG PO TABS: 100.0000 mg | ORAL_TABLET | Freq: Every day | ORAL | 1 refills | Status: AC

## 2023-06-05 NOTE — Patient Instructions (Signed)
 1. Thyroid disorder screen (Primary)  - TSH  2. Lipid screening  - Lipid Panel  3. Chronic combined systolic and diastolic CHF (congestive heart failure) (HCC)  - CBC - Comprehensive metabolic panel with GFR
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
## 2023-06-06 LAB — LIPID PANEL
Chol/HDL Ratio: 3.7 ratio (ref 0.0–5.0)
Cholesterol, Total: 127 mg/dL (ref 100–199)
HDL: 34 mg/dL — ABNORMAL LOW (ref >39)
LDL Chol Calc (NIH): 69 mg/dL (ref 0–99)
Triglycerides: 135 mg/dL (ref 0–149)
VLDL Cholesterol Cal: 24 mg/dL (ref 5–40)

## 2023-06-06 LAB — TSH: TSH: 2.64 u[IU]/mL (ref 0.450–4.500)

## 2023-06-06 LAB — HEMOGLOBIN A1C
Est. average glucose Bld gHb Est-mCnc: 120 mg/dL
Hgb A1c MFr Bld: 5.8 % — ABNORMAL HIGH (ref 4.8–5.6)

## 2023-07-16 ENCOUNTER — Encounter: Payer: Self-pay | Admitting: Emergency Medicine

## 2023-07-16 ENCOUNTER — Ambulatory Visit
Admission: EM | Admit: 2023-07-16 | Discharge: 2023-07-16 | Disposition: A | Discharge status: Home / Self Care | Attending: Emergency Medicine | Admitting: Emergency Medicine

## 2023-07-16 DIAGNOSIS — K121 Other forms of stomatitis: Secondary | ICD-10-CM | POA: Diagnosis not present

## 2023-07-16 DIAGNOSIS — K1379 Other lesions of oral mucosa: Secondary | ICD-10-CM

## 2023-07-16 DIAGNOSIS — F17200 Nicotine dependence, unspecified, uncomplicated: Secondary | ICD-10-CM | POA: Diagnosis not present

## 2023-07-16 MED ORDER — MAGIC MOUTHWASH: 5.0000 mL | Freq: Four times a day (QID) | ORAL | 0 refills | Status: AC | PRN

## 2023-07-16 NOTE — ED Triage Notes (Signed)
 Pt presents with sores in his mouth x 4 days.
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
## 2023-07-16 NOTE — Discharge Instructions (Addendum)
 Use magic mouthwash Avoid smoking, Alcohol, Spicy greasy fried foods Follow up with ENT-call for appt Follow up with dental provider-call for appt

## 2023-07-20 ENCOUNTER — Encounter: Payer: Self-pay | Admitting: Family Medicine

## 2023-07-20 ENCOUNTER — Ambulatory Visit: Admitting: Family Medicine

## 2023-07-20 ENCOUNTER — Telehealth: Payer: Self-pay

## 2023-07-20 VITALS — BP 158/86 | HR 59 | Ht 72.0 in | Wt 226.5 lb

## 2023-07-20 DIAGNOSIS — E782 Mixed hyperlipidemia: Secondary | ICD-10-CM

## 2023-07-20 DIAGNOSIS — I5042 Chronic combined systolic (congestive) and diastolic (congestive) heart failure: Secondary | ICD-10-CM

## 2023-07-20 DIAGNOSIS — Z8719 Personal history of other diseases of the digestive system: Secondary | ICD-10-CM | POA: Insufficient documentation

## 2023-07-20 DIAGNOSIS — F419 Anxiety disorder, unspecified: Secondary | ICD-10-CM

## 2023-07-20 DIAGNOSIS — F32A Depression, unspecified: Secondary | ICD-10-CM | POA: Insufficient documentation

## 2023-07-20 DIAGNOSIS — Z860100 Personal history of colon polyps, unspecified: Secondary | ICD-10-CM

## 2023-07-20 DIAGNOSIS — F17200 Nicotine dependence, unspecified, uncomplicated: Secondary | ICD-10-CM | POA: Diagnosis not present

## 2023-07-20 DIAGNOSIS — I10 Essential (primary) hypertension: Secondary | ICD-10-CM

## 2023-07-20 DIAGNOSIS — Z8551 Personal history of malignant neoplasm of bladder: Secondary | ICD-10-CM | POA: Insufficient documentation

## 2023-07-20 DIAGNOSIS — Z716 Tobacco abuse counseling: Secondary | ICD-10-CM | POA: Diagnosis not present

## 2023-07-20 DIAGNOSIS — J441 Chronic obstructive pulmonary disease with (acute) exacerbation: Secondary | ICD-10-CM

## 2023-07-20 MED ORDER — TRIAMCINOLONE ACETONIDE 0.1 % MT PSTE: 1.0000 | PASTE | Freq: Two times a day (BID) | OROMUCOSAL | 1 refills | Status: AC

## 2023-07-20 MED ORDER — BUSPIRONE HCL 15 MG PO TABS: 15.0000 mg | ORAL_TABLET | Freq: Three times a day (TID) | ORAL | 0 refills | Status: AC

## 2023-07-20 MED ORDER — CLOBETASOL PROPIONATE 0.05 % EX GEL: 1.0000 | Freq: Two times a day (BID) | CUTANEOUS | 0 refills | Status: DC

## 2023-07-20 MED ORDER — EMPAGLIFLOZIN 25 MG PO TABS: 25.0000 mg | ORAL_TABLET | Freq: Every day | ORAL | 0 refills | Status: DC

## 2023-07-20 NOTE — Telephone Encounter (Signed)
 Copied from CRM [id number]. Topic: Clinical - Medication Question >> Jul 20, 2023  1:02 PM Nicholas Chung wrote: Reason for CRM: Patient and his wife are calling in because patient was seen in the office today and was sent in three medications. Patient and his wife were under the impression that a cream was going to be sent in for patient, not a gel. They want to know was this correctly sent in or was it supposed to be a cream? She says the pharmacy does not have the gel, but they have the cream. Moira Andrews is requesting a call back. Please advise.

## 2023-07-20 NOTE — Addendum Note (Signed)
 Addended by: Vena Gibes on: 07/20/2023 03:34 PM   Modules accepted: Orders

## 2023-07-20 NOTE — Assessment & Plan Note (Signed)
 Discussed several medications, pt agreed to try Buspar . Rx sent.

## 2023-07-20 NOTE — Assessment & Plan Note (Signed)
 Unclear etiology,  Apthous ulcer , Lichen planus, Leukoplakia,  Continue Mouth wash, Rx sent for oral steroids gel. Recommend Dermatology for further evaluation if symptoms dont resolve, Avoid smoking, spicy food, hot beverages.

## 2023-07-20 NOTE — Telephone Encounter (Signed)
 I called in [employer]. Pt notified.

## 2023-07-20 NOTE — Assessment & Plan Note (Signed)
 Stable, On Coreg , Aldactone , Jardiance .  Failed to follow up with Cardiology.
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
## 2023-07-20 NOTE — Assessment & Plan Note (Signed)
 Not at goal, will increase dose of Jardiance , Reassess next visit.  Continue DASH diet, BP monitor.  Goal <130/80.

## 2023-07-20 NOTE — Assessment & Plan Note (Signed)
 GI referral placed, outside records requested.

## 2023-07-20 NOTE — Assessment & Plan Note (Signed)
 On statin.  Lab Results  Component Value Date   CHOL 127 06/05/2023   HDL 34 (L) 06/05/2023   LDLCALC 69 06/05/2023   TRIG 135 06/05/2023   CHOLHDL 3.7 06/05/2023

## 2023-07-20 NOTE — Addendum Note (Signed)
 Addended by: Vena Gibes on: 07/20/2023 03:38 PM   Modules accepted: Orders

## 2023-07-20 NOTE — Assessment & Plan Note (Addendum)
 Stable, quit smoking, not on any inhalers, stopped using albuterol  inhaler long time ago, Not on any controllers in the past.  Will consider PFTs in future.

## 2023-08-06 ENCOUNTER — Other Ambulatory Visit: Payer: Self-pay | Admitting: Family Medicine

## 2023-08-06 ENCOUNTER — Other Ambulatory Visit: Payer: Self-pay | Admitting: Nurse Practitioner

## 2023-08-07 NOTE — Telephone Encounter (Signed)
 Requested medication (s) are due for refill today:   Yes  Requested medication (s) are on the active medication list:   Yes  Future visit scheduled:   No.   Established care on 07/20/2023 with Dr. Norma Beckers.   Per notes for 4 week f/u around 6/16.   Last ordered: Jardiance  #30, 0 refill given.  Unable to refill because pt for a 4 week follow up and only #30 were given with no refills by Dr. Norma Beckers.      Requested Prescriptions  Pending Prescriptions Disp Refills   JARDIANCE  25 MG TABS tablet [Pharmacy Med Name: Jardiance  25 MG Oral Tablet] 30 tablet 0    Sig: TAKE 1 TABLET BY MOUTH ONCE DAILY BEFORE BREAKFAST     Endocrinology:  Diabetes - SGLT2 Inhibitors Passed - 08/07/2023  1:34 PM      Passed - Cr in normal range and within 360 days    Creatinine, Ser  Date Value Ref Range Status  05/07/2023 0.93 0.61 - 1.24 mg/dL Final         Passed - HBA1C is between 0 and 7.9 and within 180 days    Hgb A1c MFr Bld  Date Value Ref Range Status  06/05/2023 5.8 (H) 4.8 - 5.6 % Final    Comment:             Prediabetes: 5.7 - 6.4          Diabetes: >6.4          Glycemic control for adults with diabetes: <7.0          Passed - eGFR in normal range and within 360 days    GFR, Estimated  Date Value Ref Range Status  05/07/2023 >60 >60 mL/min Final    Comment:    (NOTE) Calculated using the CKD-EPI Creatinine Equation (2021)          Passed - Valid encounter within last 6 months    Recent Outpatient Visits           2 weeks ago H/O oral aphthous ulcers   [place] [place] [facility], Vinay K, MD       Future Appointments             In 4 months Jerrlyn Morel, NP [facility] - [facility] Dept Of [facility]

## 2023-09-25 ENCOUNTER — Other Ambulatory Visit: Payer: Self-pay | Admitting: Family Medicine

## 2023-09-25 ENCOUNTER — Other Ambulatory Visit: Payer: Self-pay | Admitting: Nurse Practitioner

## 2023-09-28 NOTE — Telephone Encounter (Signed)
 Requested medication (s) are due for refill today:   Yes  Requested medication (s) are on the active medication list:   Yes  Future visit scheduled:   No.  Established care 07/20/2023 with Dr. Sol.  Was for f/u around 08/17/2023.   Last ordered: 08/07/2023 #30, 0 refills  Unable to refill because did not come in for f/u appt and only #30 were prescribed.    Noticed he has an upcoming appt with Bascom Borer, NP on 12/07/2023.   That's his former PCP.      Requested Prescriptions  Pending Prescriptions Disp Refills   JARDIANCE  25 MG TABS tablet [Pharmacy Med Name: Jardiance  25 MG Oral Tablet] 30 tablet 0    Sig: TAKE 1 TABLET BY MOUTH ONCE DAILY BEFORE BREAKFAST     Endocrinology:  Diabetes - SGLT2 Inhibitors Passed - 09/28/2023  1:02 PM      Passed - Cr in normal range and within 360 days    Creatinine, Ser  Date Value Ref Range Status  05/07/2023 0.93 0.61 - 1.24 mg/dL Final         Passed - HBA1C is between 0 and 7.9 and within 180 days    Hgb A1c MFr Bld  Date Value Ref Range Status  06/05/2023 5.8 (H) 4.8 - 5.6 % Final    Comment:             Prediabetes: 5.7 - 6.4          Diabetes: >6.4          Glycemic control for adults with diabetes: <7.0          Passed - eGFR in normal range and within 360 days    GFR, Estimated  Date Value Ref Range Status  05/07/2023 >60 >60 mL/min Final    Comment:    (NOTE) Calculated using the CKD-EPI Creatinine Equation (2021)          Passed - Valid encounter within last 6 months    Recent Outpatient Visits           2 months ago H/O oral aphthous ulcers   [place] [facility], Vinay K, MD       Future Appointments             In 2 months Borer Bascom RAMAN, NP [facility] [facility] - [facility] Dept Of [facility]

## 2023-11-05 ENCOUNTER — Other Ambulatory Visit: Payer: Self-pay | Admitting: Family Medicine

## 2023-11-05 NOTE — Telephone Encounter (Signed)
 Requested Prescriptions  Pending Prescriptions Disp Refills   carvedilol  (COREG ) 25 MG tablet [Pharmacy Med Name: Carvedilol  25 MG Oral Tablet] 180 tablet 0    Sig: Take 1 tablet by mouth twice daily     Cardiovascular: Beta Blockers 3 Failed - 11/05/2023  4:16 PM      Failed - AST in normal range and within 360 days    AST  Date Value Ref Range Status  07/18/2022 36 15 - 41 U/L Final         Failed - ALT in normal range and within 360 days    ALT  Date Value Ref Range Status  07/18/2022 27 0 - 44 U/L Final         Failed - Last BP in normal range    BP Readings from Last 1 Encounters:  07/20/23 (!) 158/86         Passed - Cr in normal range and within 360 days    Creatinine, Ser  Date Value Ref Range Status  05/07/2023 0.93 0.61 - 1.24 mg/dL Final         Passed - Last Heart Rate in normal range    Pulse Readings from Last 1 Encounters:  07/20/23 (!) 59         Passed - Valid encounter within last 6 months    Recent Outpatient Visits           3 months ago H/O oral aphthous ulcers   [place] [facility], Vinay K, MD       Future Appointments             In 1 month Nicholas Bascom RAMAN, NP [place] [facility] - [facility] Of [facility] [place]             spironolactone  (ALDACTONE ) 25 MG tablet [Pharmacy Med Name: Spironolactone  25 MG Oral Tablet] 90 tablet 0    Sig: Take 1 tablet by mouth once daily     Cardiovascular: Diuretics - Aldosterone Antagonist Failed - 11/05/2023  4:16 PM      Failed - Cr in normal range and within 180 days    Creatinine, Ser  Date Value Ref Range Status  05/07/2023 0.93 0.61 - 1.24 mg/dL Final         Failed - K in normal range and within 180 days    Potassium  Date Value Ref Range Status  05/07/2023 4.7 3.5 - 5.1 mmol/L Final         Failed - Na in normal range and within 180 days    Sodium  Date Value Ref Range Status  05/07/2023 139 135 - 145  mmol/L Final         Failed - eGFR is 30 or above and within 180 days    GFR, Estimated  Date Value Ref Range Status  05/07/2023 >60 >60 mL/min Final    Comment:    (NOTE) Calculated using the CKD-EPI Creatinine Equation (2021)          Failed - Last BP in normal range    BP Readings from Last 1 Encounters:  07/20/23 (!) 158/86         Passed - Valid encounter within last 6 months    Recent Outpatient Visits           3 months ago H/O oral aphthous ulcers   [place] [facility] at  [facility], Vinay K, MD       Future Appointments             In 1 month Nicholas Bascom RAMAN, NP [place] [facility] - [facility] Of [facility] [place]

## 2023-12-07 ENCOUNTER — Ambulatory Visit: Payer: Self-pay | Admitting: Nurse Practitioner

## 2024-02-04 IMAGING — CT CT HEAD W/O CM
4 series · 17 of 47 positions shown, 19 images · non-contrast
Comparison: None Available.

CLINICAL DATA: Altered mental status, dizziness, multiple falls



[Series 2: head wo · axial · 0.49mm/px · z∈[-83,+42]mm · 7 of 35 slices shown, 9 images]
[im 5/35  brain]
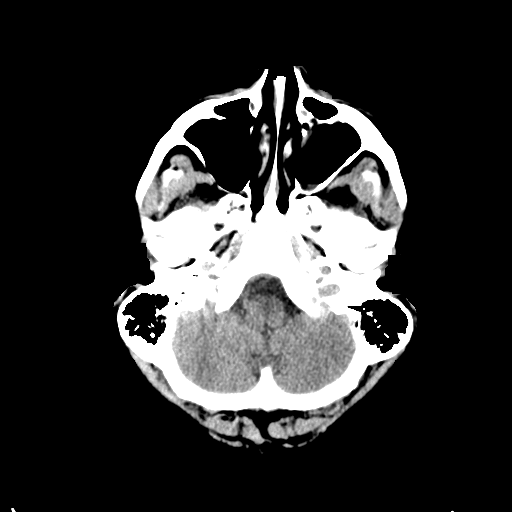
[im 5/35  bone]
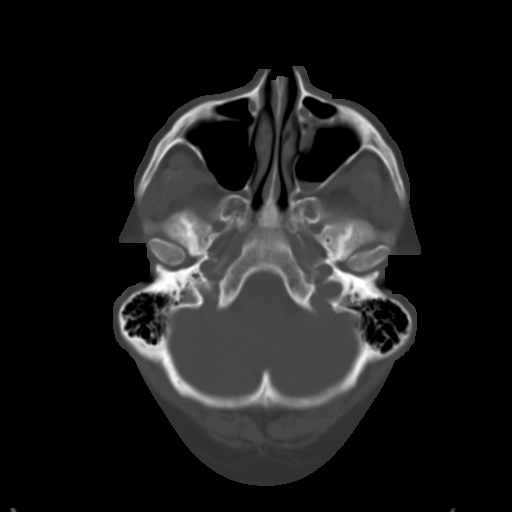
[im 9/35  brain]
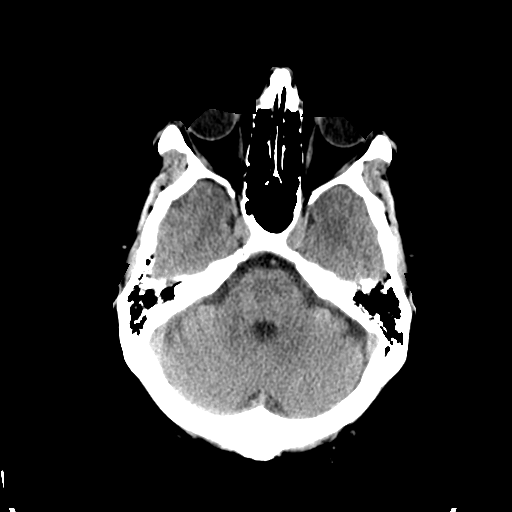
[im 13/35  brain]
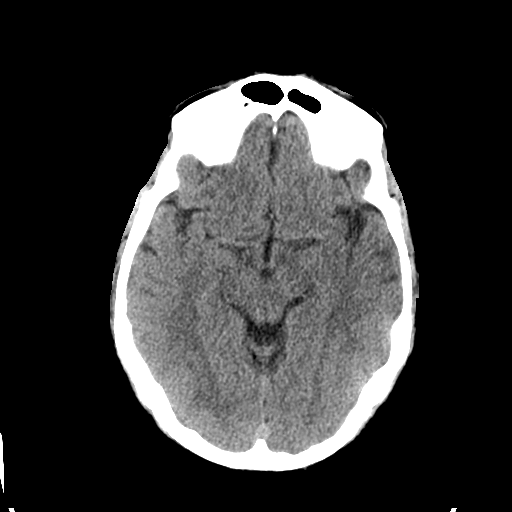
[im 18/35  brain]
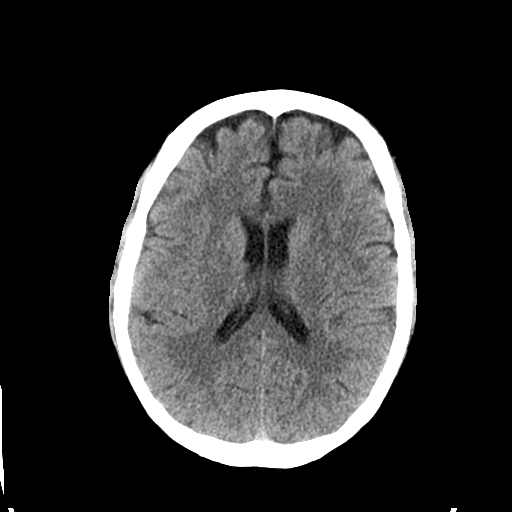
[im 22/35  brain]
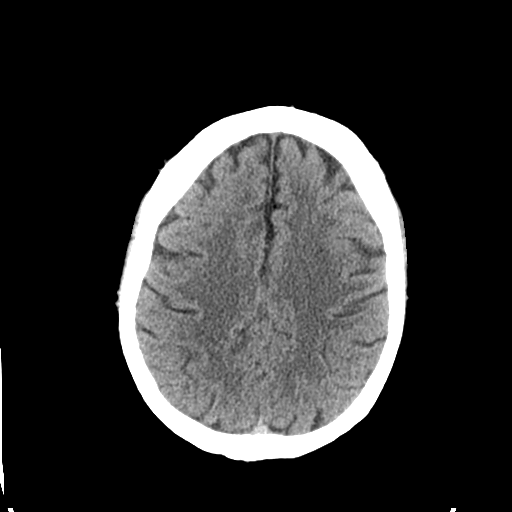
[im 22/35  bone]
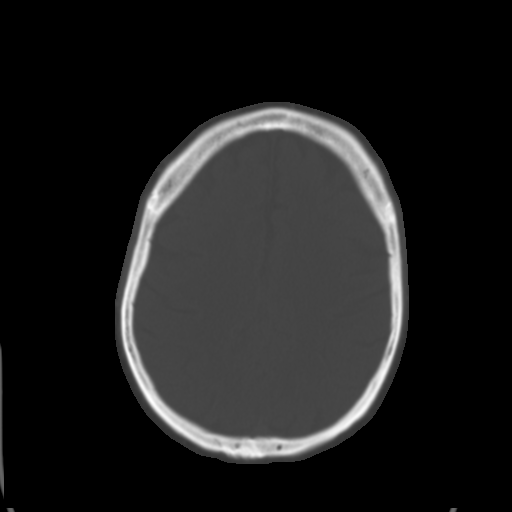
[im 26/35  brain]
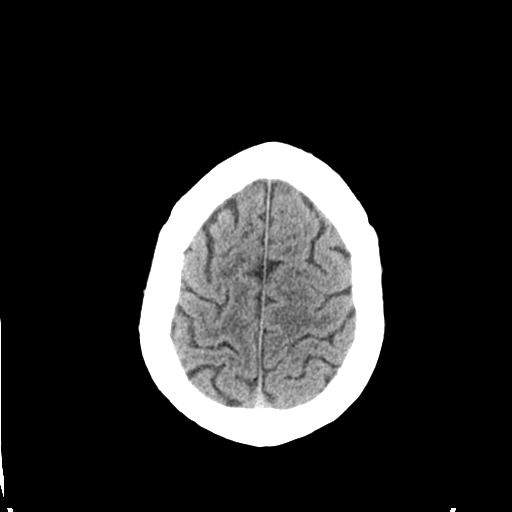
[im 30/35  brain]
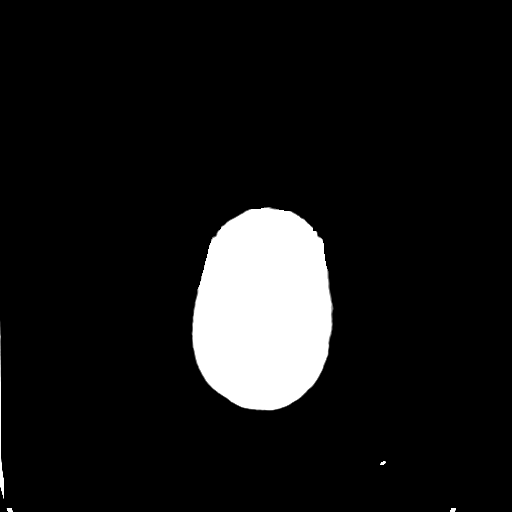

[Series 3: head bone · axial · 0.49mm/px · z∈[-87,-27]mm · 4 of 86 slices shown]
[im 9/86  bone]
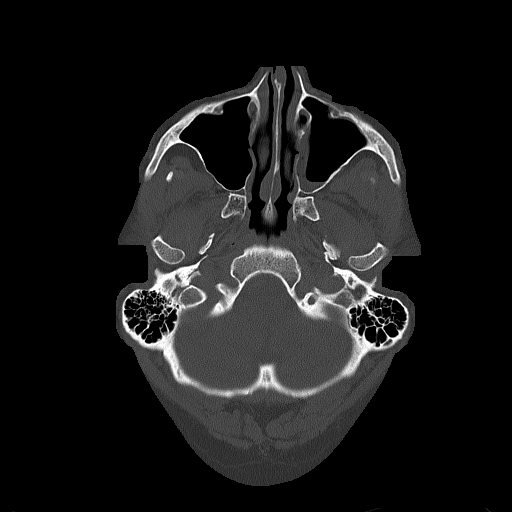
[im 18/86  bone]
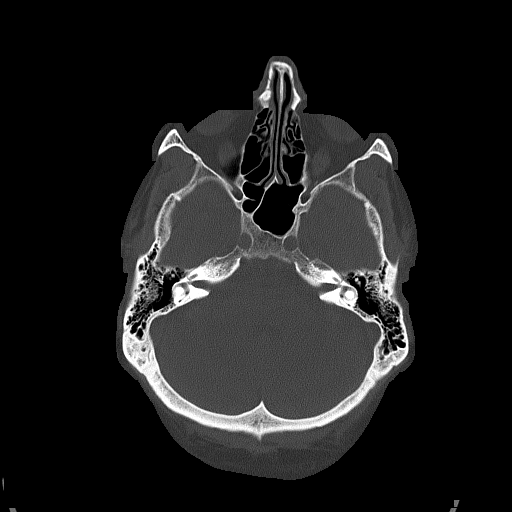
[im 26/86  bone]
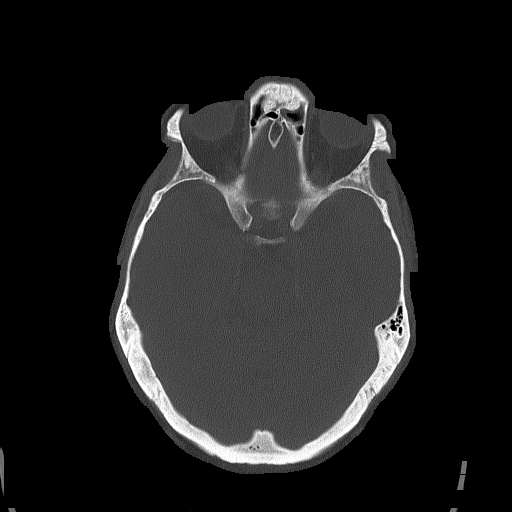
[im 39/86  bone]
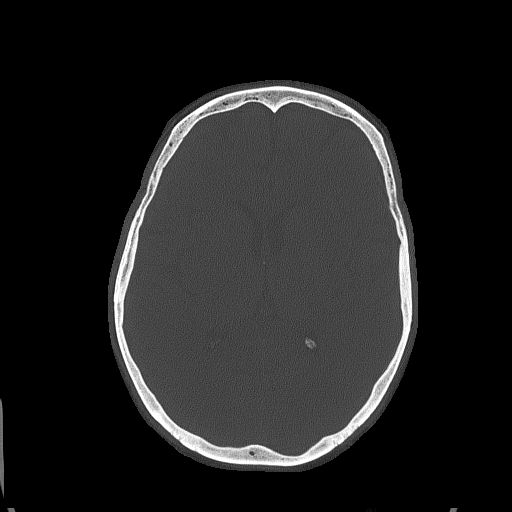

[Series 4: coronal soft tissue · coronal · 0.35mm/px · 3 of 67 slices shown]
[im 23/67  brain]
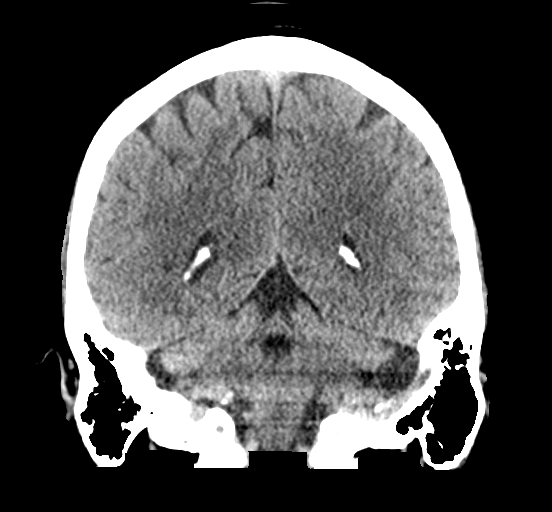
[im 30/67  brain]
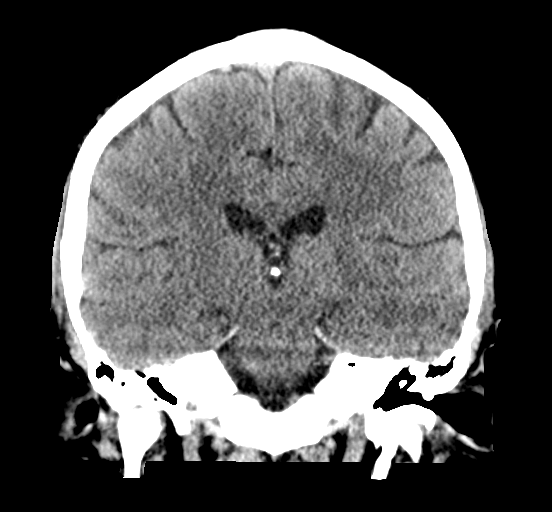
[im 37/67  brain]
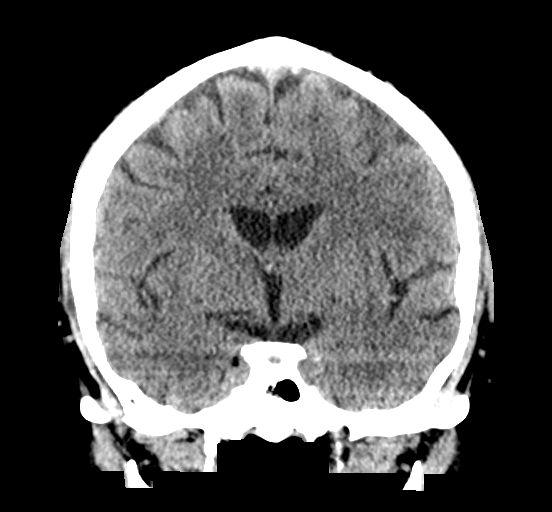

[Series 5: sagittal soft tissue · sagittal · 0.35mm/px · 3 of 54 slices shown]
[im 18/54  brain]
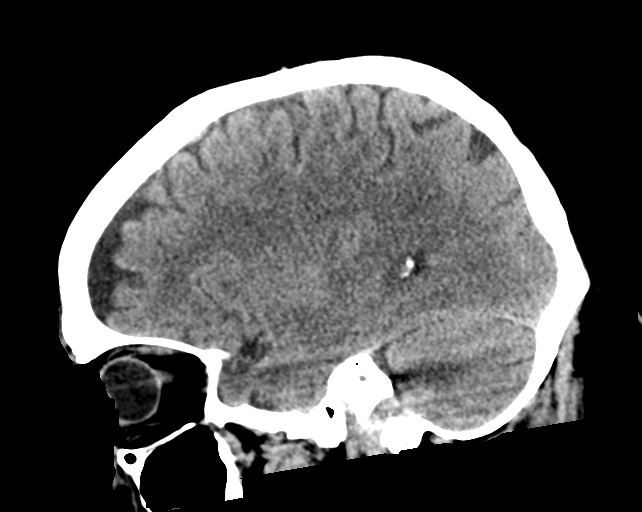
[im 27/54  brain]
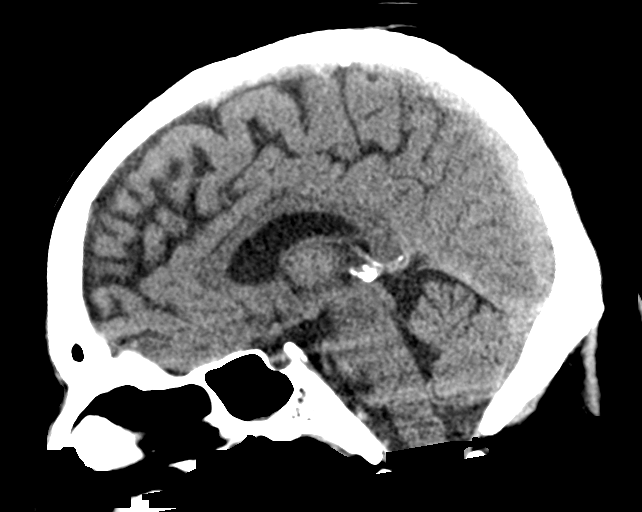
[im 36/54  brain]
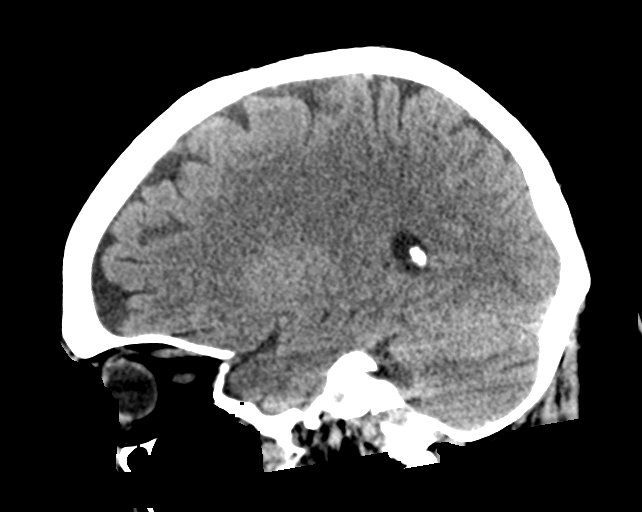

[17 of 47 positions shown; findings below may reference images not displayed]

FINDINGS: Brain: No acute intracranial findings are seen. Ventricles are not
dilated. There are no epidural or subdural fluid collections.
Cortical sulci are prominent.

Vascular: Unremarkable.

Skull: No fracture is seen.

Sinuses/Orbits: There is mucosal thickening in the left maxillary
sinus.

Other: None
IMPRESSION: No acute intracranial findings are seen in noncontrast CT brain.

Chronic left maxillary sinusitis.
